# Patient Record
Sex: Male | Born: 1980 | Race: White | Hispanic: No | Marital: Single | Smoking: Never smoker
Health system: Southern US, Community
[De-identification: ages and names within clinical notes are randomized; demographics above are authoritative.]

## PROBLEM LIST (undated history)

## (undated) DIAGNOSIS — I10 Essential (primary) hypertension: Secondary | ICD-10-CM

---
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
## 2006-04-17 ENCOUNTER — Emergency Department: Payer: Self-pay | Admitting: Emergency Medicine

## 2006-04-20 ENCOUNTER — Emergency Department (HOSPITAL_COMMUNITY): Admission: EM | Admit: 2006-04-20 | Discharge: 2006-04-21 | Payer: Self-pay | Admitting: Emergency Medicine

## 2006-04-22 ENCOUNTER — Encounter: Admission: RE | Admit: 2006-04-22 | Discharge: 2006-04-22 | Payer: Self-pay | Admitting: Family Medicine

## 2006-09-29 ENCOUNTER — Ambulatory Visit (HOSPITAL_COMMUNITY): Admission: RE | Admit: 2006-09-29 | Discharge: 2006-09-29 | Payer: Self-pay | Admitting: Endocrinology

## 2011-11-14 ENCOUNTER — Ambulatory Visit (INDEPENDENT_AMBULATORY_CARE_PROVIDER_SITE_OTHER): Payer: BC Managed Care – PPO

## 2011-11-14 DIAGNOSIS — R635 Abnormal weight gain: Secondary | ICD-10-CM

## 2011-11-14 DIAGNOSIS — E039 Hypothyroidism, unspecified: Secondary | ICD-10-CM

## 2013-08-09 ENCOUNTER — Ambulatory Visit: Payer: Self-pay | Admitting: Family Medicine

## 2013-08-09 VITALS — BP 122/82 | HR 74 | Temp 98.7°F | Resp 18 | Ht 71.5 in | Wt 257.0 lb

## 2013-08-09 DIAGNOSIS — N529 Male erectile dysfunction, unspecified: Secondary | ICD-10-CM

## 2013-08-09 DIAGNOSIS — N4889 Other specified disorders of penis: Secondary | ICD-10-CM

## 2013-08-09 MED ORDER — SILDENAFIL CITRATE 100 MG PO TABS: ORAL_TABLET | ORAL | Status: DC

## 2013-08-09 NOTE — Progress Notes (Signed)
Subjective: Patient has a several year history of erectile difficulty for which he uses Viagra. He is in the process of getting insurance in hopes to arrange for a regular primary care doctor. He usually has gotten a years worth of refills on his medication. He is usually split is pale. He is not sure when he had the 100 on the 50 mg pills.  Objective: Long discussion but did not do an exam today  Assessment: Erectile dysfunction  Plan: Viagra 100 mg TM pills with 11 refills. I told her that one fourth of a pill may be sufficient for him. If he finds he cannot split them adequately he can call back and we can change the prescription for 50 mg.

## 2013-08-09 NOTE — Patient Instructions (Addendum)
Use the Viagra as directed  A range for a complete physical sometime  Return if problems

## 2015-04-12 ENCOUNTER — Encounter: Payer: 59 | Admitting: Neurology

## 2015-04-13 ENCOUNTER — Encounter: Payer: Self-pay | Admitting: Neurology

## 2015-04-16 DIAGNOSIS — Z0289 Encounter for other administrative examinations: Secondary | ICD-10-CM

## 2015-04-20 ENCOUNTER — Emergency Department (HOSPITAL_COMMUNITY): Payer: Worker's Compensation | Admitting: Certified Registered Nurse Anesthetist

## 2015-04-20 ENCOUNTER — Encounter (HOSPITAL_COMMUNITY)
Admission: EM | Disposition: A | Discharge status: Home / Self Care | Payer: Self-pay | Source: Home / Self Care | Attending: Emergency Medicine

## 2015-04-20 ENCOUNTER — Emergency Department (HOSPITAL_COMMUNITY): Payer: Worker's Compensation

## 2015-04-20 ENCOUNTER — Observation Stay (HOSPITAL_COMMUNITY)
Admission: EM | Admit: 2015-04-20 | Discharge: 2015-04-21 | Disposition: A | Discharge status: Home / Self Care | Payer: Worker's Compensation | Attending: Orthopedic Surgery | Admitting: Orthopedic Surgery

## 2015-04-20 ENCOUNTER — Encounter (HOSPITAL_COMMUNITY): Payer: Self-pay | Admitting: Family Medicine

## 2015-04-20 DIAGNOSIS — S66422A Laceration of intrinsic muscle, fascia and tendon of left thumb at wrist and hand level, initial encounter: Secondary | ICD-10-CM | POA: Diagnosis present

## 2015-04-20 DIAGNOSIS — M25342 Other instability, left hand: Secondary | ICD-10-CM | POA: Diagnosis not present

## 2015-04-20 DIAGNOSIS — Z6839 Body mass index (BMI) 39.0-39.9, adult: Secondary | ICD-10-CM | POA: Insufficient documentation

## 2015-04-20 DIAGNOSIS — I10 Essential (primary) hypertension: Secondary | ICD-10-CM | POA: Diagnosis not present

## 2015-04-20 DIAGNOSIS — F329 Major depressive disorder, single episode, unspecified: Secondary | ICD-10-CM | POA: Insufficient documentation

## 2015-04-20 DIAGNOSIS — S61012A Laceration without foreign body of left thumb without damage to nail, initial encounter: Secondary | ICD-10-CM | POA: Diagnosis present

## 2015-04-20 DIAGNOSIS — Y9389 Activity, other specified: Secondary | ICD-10-CM | POA: Insufficient documentation

## 2015-04-20 DIAGNOSIS — W312XXA Contact with powered woodworking and forming machines, initial encounter: Secondary | ICD-10-CM | POA: Diagnosis not present

## 2015-04-20 DIAGNOSIS — Y9269 Other specified industrial and construction area as the place of occurrence of the external cause: Secondary | ICD-10-CM | POA: Insufficient documentation

## 2015-04-20 DIAGNOSIS — Y990 Civilian activity done for income or pay: Secondary | ICD-10-CM | POA: Insufficient documentation

## 2015-04-20 DIAGNOSIS — F419 Anxiety disorder, unspecified: Secondary | ICD-10-CM | POA: Diagnosis not present

## 2015-04-20 HISTORY — PX: I & D EXTREMITY: SHX5045

## 2015-04-20 HISTORY — DX: Essential (primary) hypertension: I10

## 2015-04-20 HISTORY — PX: PERCUTANEOUS PINNING: SHX2209

## 2015-04-20 LAB — I-STAT CHEM 8, ED
BUN: 21 mg/dL — AB (ref 6–20)
CREATININE: 0.9 mg/dL (ref 0.61–1.24)
Calcium, Ion: 1.23 mmol/L (ref 1.12–1.23)
Chloride: 99 mmol/L — ABNORMAL LOW (ref 101–111)
GLUCOSE: 101 mg/dL — AB (ref 65–99)
HCT: 46 % (ref 39.0–52.0)
HEMOGLOBIN: 15.6 g/dL (ref 13.0–17.0)
POTASSIUM: 4.2 mmol/L (ref 3.5–5.1)
Sodium: 136 mmol/L (ref 135–145)
TCO2: 24 mmol/L (ref 0–100)

## 2015-04-20 LAB — CBC
HEMATOCRIT: 42.9 % (ref 39.0–52.0)
HEMOGLOBIN: 14.6 g/dL (ref 13.0–17.0)
MCH: 29.9 pg (ref 26.0–34.0)
MCHC: 34 g/dL (ref 30.0–36.0)
MCV: 87.7 fL (ref 78.0–100.0)
PLATELETS: 179 10*3/uL (ref 150–400)
RBC: 4.89 MIL/uL (ref 4.22–5.81)
RDW: 12.6 % (ref 11.5–15.5)
WBC: 6.1 10*3/uL (ref 4.0–10.5)

## 2015-04-20 SURGERY — IRRIGATION AND DEBRIDEMENT EXTREMITY
Anesthesia: General | Site: Thumb | Laterality: Left

## 2015-04-20 MED ORDER — LIDOCAINE HCL (PF) 1 % IJ SOLN
5.0000 mL | Freq: Once | INTRAMUSCULAR | Status: AC
  Administered 2015-04-20: 5 mL
  Filled 2015-04-20: qty 5

## 2015-04-20 MED ORDER — LIDOCAINE HCL 2 % IJ SOLN: 10.0000 mL | Freq: Once | INTRAMUSCULAR | Status: DC

## 2015-04-20 MED ORDER — SODIUM CHLORIDE 0.9 % IJ SOLN
INTRAMUSCULAR | Status: AC
  Filled 2015-04-20: qty 10

## 2015-04-20 MED ORDER — SUCCINYLCHOLINE CHLORIDE 20 MG/ML IJ SOLN
INTRAMUSCULAR | Status: AC
  Filled 2015-04-20: qty 1

## 2015-04-20 MED ORDER — MIDAZOLAM HCL 2 MG/2ML IJ SOLN
INTRAMUSCULAR | Status: AC
  Filled 2015-04-20: qty 2

## 2015-04-20 MED ORDER — ALPRAZOLAM 0.5 MG PO TABS: 0.5000 mg | ORAL_TABLET | Freq: Four times a day (QID) | ORAL | Status: DC | PRN

## 2015-04-20 MED ORDER — PHENYLEPHRINE HCL 10 MG/ML IJ SOLN
INTRAMUSCULAR | Status: DC | PRN
  Administered 2015-04-20 (×2): 80 ug via INTRAVENOUS

## 2015-04-20 MED ORDER — LIDOCAINE HCL (PF) 1 % IJ SOLN
10.0000 mL | Freq: Once | INTRAMUSCULAR | Status: AC
  Administered 2015-04-20: 10 mL
  Filled 2015-04-20: qty 10

## 2015-04-20 MED ORDER — CEFAZOLIN SODIUM-DEXTROSE 2-3 GM-% IV SOLR
INTRAVENOUS | Status: AC
  Filled 2015-04-20: qty 50

## 2015-04-20 MED ORDER — SODIUM CHLORIDE 0.9 % IR SOLN
Status: DC | PRN
  Administered 2015-04-20: 3000 mL

## 2015-04-20 MED ORDER — CEFAZOLIN SODIUM 1-5 GM-% IV SOLN
1.0000 g | Freq: Once | INTRAVENOUS | Status: AC
  Administered 2015-04-20: 1 g via INTRAVENOUS
  Filled 2015-04-20: qty 50

## 2015-04-20 MED ORDER — CEFAZOLIN SODIUM 1-5 GM-% IV SOLN
INTRAVENOUS | Status: AC
  Filled 2015-04-20: qty 50

## 2015-04-20 MED ORDER — OXYCODONE-ACETAMINOPHEN 5-325 MG PO TABS
1.0000 | ORAL_TABLET | Freq: Once | ORAL | Status: AC
  Administered 2015-04-20: 1 via ORAL
  Filled 2015-04-20: qty 1

## 2015-04-20 MED ORDER — LACTATED RINGERS IV SOLN
INTRAVENOUS | Status: DC
  Administered 2015-04-21: 02:00:00 via INTRAVENOUS

## 2015-04-20 MED ORDER — DEXAMETHASONE SODIUM PHOSPHATE 4 MG/ML IJ SOLN
INTRAMUSCULAR | Status: AC
  Filled 2015-04-20: qty 2

## 2015-04-20 MED ORDER — PROPOFOL 10 MG/ML IV BOLUS
INTRAVENOUS | Status: AC
  Filled 2015-04-20: qty 20

## 2015-04-20 MED ORDER — EPHEDRINE SULFATE 50 MG/ML IJ SOLN
INTRAMUSCULAR | Status: AC
  Filled 2015-04-20: qty 2

## 2015-04-20 MED ORDER — VITAMIN C 500 MG PO TABS: 1000.0000 mg | ORAL_TABLET | Freq: Every day | ORAL | Status: DC

## 2015-04-20 MED ORDER — FENTANYL CITRATE (PF) 100 MCG/2ML IJ SOLN
25.0000 ug | INTRAMUSCULAR | Status: DC | PRN
  Administered 2015-04-20: 25 ug via INTRAVENOUS
  Administered 2015-04-20: 50 ug via INTRAVENOUS

## 2015-04-20 MED ORDER — CEFAZOLIN SODIUM-DEXTROSE 2-3 GM-% IV SOLR
INTRAVENOUS | Status: DC | PRN
  Administered 2015-04-20: 2 g via INTRAVENOUS

## 2015-04-20 MED ORDER — FENTANYL CITRATE (PF) 100 MCG/2ML IJ SOLN
INTRAMUSCULAR | Status: AC
  Administered 2015-04-20: 50 ug via INTRAVENOUS
  Filled 2015-04-20: qty 2

## 2015-04-20 MED ORDER — LACTATED RINGERS IV SOLN
INTRAVENOUS | Status: DC | PRN
  Administered 2015-04-20 (×2): via INTRAVENOUS

## 2015-04-20 MED ORDER — PROPOFOL 10 MG/ML IV BOLUS
INTRAVENOUS | Status: DC | PRN
  Administered 2015-04-20: 250 mg via INTRAVENOUS

## 2015-04-20 MED ORDER — OXYCODONE HCL 5 MG/5ML PO SOLN: 5.0000 mg | Freq: Once | ORAL | Status: AC | PRN

## 2015-04-20 MED ORDER — FENTANYL CITRATE (PF) 100 MCG/2ML IJ SOLN
INTRAMUSCULAR | Status: DC | PRN
  Administered 2015-04-20 (×3): 50 ug via INTRAVENOUS

## 2015-04-20 MED ORDER — EPHEDRINE SULFATE 50 MG/ML IJ SOLN
INTRAMUSCULAR | Status: DC | PRN
  Administered 2015-04-20: 10 mg via INTRAVENOUS

## 2015-04-20 MED ORDER — OXYCODONE HCL 5 MG PO TABS
ORAL_TABLET | ORAL | Status: AC
  Filled 2015-04-20: qty 1

## 2015-04-20 MED ORDER — OXYCODONE HCL 5 MG PO TABS
5.0000 mg | ORAL_TABLET | Freq: Once | ORAL | Status: AC | PRN
  Administered 2015-04-20: 5 mg via ORAL

## 2015-04-20 MED ORDER — MIDAZOLAM HCL 5 MG/5ML IJ SOLN
INTRAMUSCULAR | Status: DC | PRN
  Administered 2015-04-20: 2 mg via INTRAVENOUS

## 2015-04-20 MED ORDER — BUPIVACAINE HCL (PF) 0.25 % IJ SOLN
INTRAMUSCULAR | Status: DC | PRN
  Administered 2015-04-20: 30 mL

## 2015-04-20 MED ORDER — CEFAZOLIN SODIUM 1-5 GM-% IV SOLN
1.0000 g | Freq: Three times a day (TID) | INTRAVENOUS | Status: DC
Start: 2015-04-20 — End: 2015-04-21
  Administered 2015-04-20 – 2015-04-21 (×2): 1 g via INTRAVENOUS
  Filled 2015-04-20 (×3): qty 50

## 2015-04-20 MED ORDER — ONDANSETRON HCL 4 MG/2ML IJ SOLN
INTRAMUSCULAR | Status: DC | PRN
  Administered 2015-04-20: 4 mg via INTRAVENOUS

## 2015-04-20 MED ORDER — OXYCODONE HCL 5 MG PO TABS
5.0000 mg | ORAL_TABLET | ORAL | Status: DC | PRN
  Administered 2015-04-21 (×2): 10 mg via ORAL
  Filled 2015-04-20 (×2): qty 2

## 2015-04-20 MED ORDER — MORPHINE SULFATE 2 MG/ML IJ SOLN: 1.0000 mg | INTRAMUSCULAR | Status: DC | PRN

## 2015-04-20 MED ORDER — BUPIVACAINE HCL (PF) 0.25 % IJ SOLN
INTRAMUSCULAR | Status: AC
  Filled 2015-04-20: qty 30

## 2015-04-20 MED ORDER — 0.9 % SODIUM CHLORIDE (POUR BTL) OPTIME
TOPICAL | Status: DC | PRN
  Administered 2015-04-20: 1000 mL

## 2015-04-20 MED ORDER — FENTANYL CITRATE (PF) 250 MCG/5ML IJ SOLN
INTRAMUSCULAR | Status: AC
  Filled 2015-04-20: qty 5

## 2015-04-20 MED ORDER — LIDOCAINE HCL (CARDIAC) 20 MG/ML IV SOLN
INTRAVENOUS | Status: DC | PRN
  Administered 2015-04-20: 60 mg via INTRAVENOUS

## 2015-04-20 MED ORDER — LIDOCAINE HCL (CARDIAC) 20 MG/ML IV SOLN
INTRAVENOUS | Status: AC
  Filled 2015-04-20: qty 15

## 2015-04-20 MED ORDER — PHENYLEPHRINE 40 MCG/ML (10ML) SYRINGE FOR IV PUSH (FOR BLOOD PRESSURE SUPPORT)
PREFILLED_SYRINGE | INTRAVENOUS | Status: AC
  Filled 2015-04-20: qty 50

## 2015-04-20 MED ORDER — FAMOTIDINE 20 MG PO TABS: 20.0000 mg | ORAL_TABLET | Freq: Two times a day (BID) | ORAL | Status: DC | PRN

## 2015-04-20 MED ORDER — DEXAMETHASONE SODIUM PHOSPHATE 10 MG/ML IJ SOLN
INTRAMUSCULAR | Status: DC | PRN
  Administered 2015-04-20: 8 mg via INTRAVENOUS

## 2015-04-20 SURGICAL SUPPLY — 61 items
APL SKNCLS STERI-STRIP NONHPOA (GAUZE/BANDAGES/DRESSINGS)
BANDAGE ELASTIC 3 VELCRO ST LF (GAUZE/BANDAGES/DRESSINGS) ×3 IMPLANT
BANDAGE ELASTIC 4 VELCRO ST LF (GAUZE/BANDAGES/DRESSINGS) ×3 IMPLANT
BENZOIN TINCTURE PRP APPL 2/3 (GAUZE/BANDAGES/DRESSINGS) IMPLANT
BLADE SURG ROTATE 9660 (MISCELLANEOUS) IMPLANT
BNDG CMPR MD 5X2 ELC HKLP STRL (GAUZE/BANDAGES/DRESSINGS) ×2
BNDG CONFORM 2 STRL LF (GAUZE/BANDAGES/DRESSINGS) ×1 IMPLANT
BNDG ELASTIC 2 VLCR STRL LF (GAUZE/BANDAGES/DRESSINGS) ×1 IMPLANT
BNDG GAUZE ELAST 4 BULKY (GAUZE/BANDAGES/DRESSINGS) ×9 IMPLANT
CORDS BIPOLAR (ELECTRODE) ×3 IMPLANT
COVER SURGICAL LIGHT HANDLE (MISCELLANEOUS) ×3 IMPLANT
CUFF TOURNIQUET SINGLE 18IN (TOURNIQUET CUFF) ×3 IMPLANT
CUFF TOURNIQUET SINGLE 24IN (TOURNIQUET CUFF) IMPLANT
DRSG ADAPTIC 3X8 NADH LF (GAUZE/BANDAGES/DRESSINGS) ×3 IMPLANT
DRSG EMULSION OIL 3X3 NADH (GAUZE/BANDAGES/DRESSINGS) ×1 IMPLANT
GAUZE SPONGE 4X4 12PLY STRL (GAUZE/BANDAGES/DRESSINGS) ×3 IMPLANT
GAUZE XEROFORM 1X8 LF (GAUZE/BANDAGES/DRESSINGS) ×4 IMPLANT
GLOVE BIO SURGEON STRL SZ7.5 (GLOVE) ×1 IMPLANT
GLOVE BIOGEL M STRL SZ7.5 (GLOVE) ×3 IMPLANT
GLOVE SS BIOGEL STRL SZ 8 (GLOVE) ×2 IMPLANT
GLOVE SUPERSENSE BIOGEL SZ 8 (GLOVE) ×1
GOWN STRL REUS W/ TWL LRG LVL3 (GOWN DISPOSABLE) ×6 IMPLANT
GOWN STRL REUS W/ TWL XL LVL3 (GOWN DISPOSABLE) ×6 IMPLANT
GOWN STRL REUS W/TWL LRG LVL3 (GOWN DISPOSABLE) ×9
GOWN STRL REUS W/TWL XL LVL3 (GOWN DISPOSABLE) ×9
HANDPIECE INTERPULSE COAX TIP (DISPOSABLE)
K-WIRE .045 CH (WIRE)
KIT BASIN OR (CUSTOM PROCEDURE TRAY) ×3 IMPLANT
KIT ROOM TURNOVER OR (KITS) ×3 IMPLANT
KWIRE .045 CH (WIRE) IMPLANT
MANIFOLD NEPTUNE II (INSTRUMENTS) ×3 IMPLANT
NDL HYPO 25GX1X1/2 BEV (NEEDLE) IMPLANT
NEEDLE 22X1 1/2 (OR ONLY) (NEEDLE) ×1 IMPLANT
NEEDLE HYPO 25GX1X1/2 BEV (NEEDLE) IMPLANT
NS IRRIG 1000ML POUR BTL (IV SOLUTION) ×3 IMPLANT
PACK ORTHO EXTREMITY (CUSTOM PROCEDURE TRAY) ×3 IMPLANT
PAD ARMBOARD 7.5X6 YLW CONV (MISCELLANEOUS) ×6 IMPLANT
PAD CAST 4YDX4 CTTN HI CHSV (CAST SUPPLIES) ×2 IMPLANT
PADDING CAST COTTON 4X4 STRL (CAST SUPPLIES) ×3
SET CYSTO W/LG BORE CLAMP LF (SET/KITS/TRAYS/PACK) ×1 IMPLANT
SET HNDPC FAN SPRY TIP SCT (DISPOSABLE) IMPLANT
SPLINT FIBERGLASS 3X35 (CAST SUPPLIES) ×1 IMPLANT
SPONGE GAUZE 4X4 12PLY STER LF (GAUZE/BANDAGES/DRESSINGS) ×1 IMPLANT
SPONGE LAP 18X18 X RAY DECT (DISPOSABLE) ×3 IMPLANT
SPONGE LAP 4X18 X RAY DECT (DISPOSABLE) ×3 IMPLANT
STRIP CLOSURE SKIN 1/2X4 (GAUZE/BANDAGES/DRESSINGS) IMPLANT
SUT CHROMIC 5 0 P 3 (SUTURE) ×1 IMPLANT
SUT ETHILON 4 0 P 3 18 (SUTURE) IMPLANT
SUT ETHILON 5 0 P 3 18 (SUTURE)
SUT FIBERWIRE 4-0 18 DIAM BLUE (SUTURE) ×3
SUT NYLON ETHILON 5-0 P-3 1X18 (SUTURE) IMPLANT
SUT PROLENE 4 0 P 3 18 (SUTURE) IMPLANT
SUT PROLENE 5 0 PS 2 (SUTURE) ×1 IMPLANT
SUTURE FIBERWR 4-0 18 DIA BLUE (SUTURE) IMPLANT
SYR CONTROL 10ML LL (SYRINGE) ×1 IMPLANT
TOWEL OR 17X24 6PK STRL BLUE (TOWEL DISPOSABLE) ×3 IMPLANT
TOWEL OR 17X26 10 PK STRL BLUE (TOWEL DISPOSABLE) ×3 IMPLANT
TUBE ANAEROBIC SPECIMEN COL (MISCELLANEOUS) IMPLANT
TUBE CONNECTING 12X1/4 (SUCTIONS) ×3 IMPLANT
WATER STERILE IRR 1000ML POUR (IV SOLUTION) ×3 IMPLANT
YANKAUER SUCT BULB TIP NO VENT (SUCTIONS) ×3 IMPLANT

## 2015-04-20 NOTE — Anesthesia Preprocedure Evaluation (Addendum)
Anesthesia Evaluation  Patient identified by MRN, date of birth, ID band Patient awake    Reviewed: Allergy & Precautions, NPO status , Patient's Chart, lab work & pertinent test results  History of Anesthesia Complications Negative for: history of anesthetic complications (no prior surgeries)  Airway Mallampati: III  TM Distance: >3 FB Neck ROM: Full    Dental  (+) Teeth Intact   Pulmonary neg pulmonary ROS,  breath sounds clear to auscultation        Cardiovascular hypertension, Pt. on medications - angina- Past MI and - CHF - dysrhythmias Rhythm:Regular     Neuro/Psych PSYCHIATRIC DISORDERS Anxiety negative neurological ROS     GI/Hepatic negative GI ROS, Neg liver ROS,   Endo/Other  Morbid obesity  Renal/GU negative Renal ROS     Musculoskeletal   Abdominal   Peds  Hematology   Anesthesia Other Findings   Reproductive/Obstetrics                            Anesthesia Physical Anesthesia Plan  ASA: II  Anesthesia Plan: General   Post-op Pain Management:    Induction: Intravenous  Airway Management Planned: LMA  Additional Equipment: None  Intra-op Plan:   Post-operative Plan: Extubation in OR  Informed Consent: I have reviewed the patients History and Physical, chart, labs and discussed the procedure including the risks, benefits and alternatives for the proposed anesthesia with the patient or authorized representative who has indicated his/her understanding and acceptance.   Dental advisory given  Plan Discussed with: Surgeon and CRNA  Anesthesia Plan Comments:        Anesthesia Quick Evaluation

## 2015-04-20 NOTE — Progress Notes (Signed)
Orthopedic Tech Progress Note Patient Details:  Justin Archer [birth year] [id number]  Ortho Devices Ortho Device/Splint Location: Rica Mast sling Ortho Device/Splint Interventions: Ordered, Application, Adjustment   Jennye Moccasin 04/20/2015, 10:33 PM

## 2015-04-20 NOTE — Op Note (Signed)
See op note  [id number] Candelario Steppe MD

## 2015-04-20 NOTE — ED Notes (Signed)
Pt states he "needs a minute before I collect any blood."

## 2015-04-20 NOTE — Anesthesia Procedure Notes (Addendum)
Procedure Name: LMA Insertion Date/Time: 04/20/2015 8:05 PM Performed by: Arlice Colt B Pre-anesthesia Checklist: Patient identified, Emergency Drugs available, Suction available, Patient being monitored and Timeout performed Patient Re-evaluated:Patient Re-evaluated prior to inductionOxygen Delivery Method: Circle system utilized Preoxygenation: Pre-oxygenation with 100% oxygen Intubation Type: IV induction LMA: LMA inserted LMA Size: 5.0 Placement Confirmation: positive ETCO2 Tube secured with: Tape Dental Injury: Teeth and Oropharynx as per pre-operative assessment

## 2015-04-20 NOTE — ED Provider Notes (Signed)
The patient is a 34 year old male who presents after suffering an injury to his left thumb with a chainsaw that happened at work. This occurred just prior to arrival. His pain is moderate to severe, worse with palpation, associated with an open injury over the interphalangeal joint of the left thumb. The patient is unable to hold his thumb in extension against resistance, exploration of the wound shows the structures is joint involvement. No foreign body seen.  I have personally viewed and interpreted the imaging and agree with radiologist interpretation. No FB or frx seen, no air in joint  Will need hand surgery consultation.  abx Npo  Medical screening examination/treatment/procedure(s) were conducted as a shared visit with non-physician practitioner(s) and myself.  I personally evaluated the patient during the encounter.  Clinical Impression:   Final diagnoses:  Laceration of left thumb with tendon involvement, initial encounter         Eber Hong, MD 04/21/15 [phone]

## 2015-04-20 NOTE — Transfer of Care (Signed)
Immediate Anesthesia Transfer of Care Note  Patient: Justin Archer  Procedure(s) Performed: Procedure(s): IRRIGATION AND DEBRIDEMENT LEFT THUMB WITH REPAIR EPL TENDON (Left)  IP PINNING (Left)  Patient Location: PACU  Anesthesia Type:General  Level of Consciousness: awake, alert  and oriented  Airway & Oxygen Therapy: Patient Spontanous Breathing  Post-op Assessment: Report given to RN and Post -op Vital signs reviewed and stable  Post vital signs: Reviewed and stable  Last Vitals:  Filed Vitals:   04/20/15 1857  BP: 128/81  Pulse: 76  Temp:   Resp: 20    Complications: No apparent anesthesia complications

## 2015-04-20 NOTE — ED Notes (Signed)
Pt here for left thumb laceration. sts was at work working with a saw and a piece came off and sliced finger.

## 2015-04-20 NOTE — H&P (Signed)
NOELLE HOOGLAND is an 34 y.o. male.   Chief Complaint: I cut my thumb HPI: The patient is a pleasant 34 year old male presents to the emergency room setting for evaluation of his left thumb. He sustained an on-the-job injury earlier today, he is employed Engineer, manufacturing. He is employed in [employer] and was working on a unit utilizing a saw, when he sustained a laceration to the dorsal aspect of the left thumb. Noted to have significant bleeding and loss of extension and presented to the emergency room setting for further evaluation. On evaluation by the emergency room staff he was noted to have loss of EPL functioning. The patient underwent digital block and irrigation. We were contacted in regards to his injury process. He denies injury to the remaining portion of the hand or extremity.  Past Medical History  Diagnosis Date  . Hypertension    depression  History reviewed. No pertinent past surgical history.  History reviewed. No pertinent family history. Social History:  reports that he has never smoked. He does not have any smokeless tobacco history on file. He reports that he does not drink alcohol or use illicit drugs.  Allergies:  Allergies  Allergen Reactions  . Iodine    Medications:  Metoprolol Chlorthalidone Paroxetine  (Not in a hospital admission)  Results for orders placed or performed during the hospital encounter of 04/20/15 (from the past 48 hour(s))  CBC     Status: None   Collection Time: 04/20/15 12:50 PM  Result Value Ref Range   WBC 6.1 4.0 - 10.5 K/uL   RBC 4.89 4.22 - 5.81 MIL/uL   Hemoglobin 14.6 13.0 - 17.0 g/dL   HCT 16.1 09.6 - 04.5 %   MCV 87.7 78.0 - 100.0 fL   MCH 29.9 26.0 - 34.0 pg   MCHC 34.0 30.0 - 36.0 g/dL   RDW 40.9 81.1 - 91.4 %   Platelets 179 150 - 400 K/uL  I-stat chem 8, ed     Status: Abnormal   Collection Time: 04/20/15 12:59 PM  Result Value Ref Range   Sodium 136 135 - 145 mmol/L   Potassium 4.2 3.5 - 5.1 mmol/L   Chloride 99 (L) 101 - 111 mmol/L   BUN 21 (H) 6 - 20 mg/dL   Creatinine, Ser 7.82 0.61 - 1.24 mg/dL   Glucose, Bld 956 (H) 65 - 99 mg/dL   Calcium, Ion 2.13 0.86 - 1.23 mmol/L   TCO2 24 0 - 100 mmol/L   Hemoglobin 15.6 13.0 - 17.0 g/dL   HCT 57.8 46.9 - 62.9 %   Dg Finger Thumb Left  04/20/2015   CLINICAL DATA:  Circular Sol accident with soft tissue laceration  EXAM: LEFT THUMB 2+V  COMPARISON:  None.  FINDINGS: A soft tissue laceration is noted over the dorsal aspect of the interphalangeal joint of the thumb. No underlying bony abnormality is seen. No radiopaque foreign body is noted.  IMPRESSION: Soft tissue injury without acute bony abnormality.   Electronically Signed   By: Alcide Clever M.D.   On: 04/20/2015 11:43    Review of Systems  Constitutional: Negative.   HENT: Negative.   Eyes: Negative.   Respiratory: Negative.   Cardiovascular: Negative.   Gastrointestinal: Negative.   Musculoskeletal:       See history present illness  Skin: Negative.   Neurological: Negative.     Blood pressure 129/78, pulse 69, temperature 97.6 F (36.4 C), temperature source Oral, resp. rate 16, height  (  1.854 m), weight 136.079 kg (300 lb), SpO2 99 %. Physical Exam  Evaluation of the left upper extremity shows that he has approximately a 2 cm laceration of the dorsal P of the left thumb. He is briefly been given a digital block and has some remaining numbness present. We have given him additional 10 cc of Xylocaine without epinephrine earlier examine the wound. Patient has a notable EPL laceration to the level of the bony phalanx present. Obvious loss of extensor functioning. Refill is intact. Flexion is intact. Main portion of the hand exam is without significant abnormality .The patient is alert and oriented in no acute distress. The patient complains of pain in the affected upper extremity.  The patient is noted to have a normal HEENT exam. Lung fields show equal chest expansion and no  shortness of breath. Abdomen exam is nontender without distention. Lower extremity examination does not show any fracture dislocation or blood clot symptoms. Pelvis is stable and the neck and back are stable and nontender.  Assessment/Plan On-the-job injury sustaining a laceration to the left dorsal thumb with loss of EPL function  History of hypertension History of depression .We are planning surgery for your upper extremity. The risk and benefits of surgery to include risk of bleeding, infection, anesthesia,  damage to normal structures and failure of the surgery to accomplish its intended goals of relieving symptoms and restoring function have been discussed in detail. With this in mind we plan to proceed. I have specifically discussed with the patient the pre-and postoperative regime and the dos and don'ts and risk and benefits in great detail. Risk and benefits of surgery also include risk of dystrophy(CRPS), chronic nerve pain, failure of the healing process to go onto completion and other inherent risks of surgery The relavent the pathophysiology of the disease/injury process, as well as the alternatives for treatment and postoperative course of action has been discussed in great detail with the patient who desires to proceed.  We will do everything in our power to help you (the patient) restore function to the upper extremity. It is a pleasure to see this patient today.   Audryna Wendt L 04/20/2015, 5:38 PM

## 2015-04-20 NOTE — ED Provider Notes (Signed)
CSN: [id number]     Arrival date & time 04/20/15  1036 History  This chart was scribed for non-physician practitioner, Fayrene Helper, PA-C, working with Eber Hong, MD by Charline Bills, ED Scribe. This patient was seen in room TR09C/TR09C and the patient's care was started at 10:49 AM.   Chief Complaint  Patient presents with  . Laceration   The history is provided by the patient. No language interpreter was used.   HPI Comments: Justin Archer is a 34 y.o. male who presents to the Emergency Department complaining of a laceration to left thumb sustained approximately 30 minutes ago. Pt was using a circular saw at work when a piece of the saw came off and sliced his thumb. He denies associated pain at this time but reports numbness that he attributes to carpal tunnel. Pt reports difficulty extending his thumb. He further reports that his thumb feels like "2 rods pushing against each other" with extension. Pt is R hand dominant. Pt's tdap is UTD. Allergy to Iodine.   Past Medical History  Diagnosis Date  . Hypertension    History reviewed. No pertinent past surgical history. History reviewed. No pertinent family history. History  Substance Use Topics  . Smoking status: Never Smoker   . Smokeless tobacco: Not on file  . Alcohol Use: No    Review of Systems  Skin: Positive for wound.  Neurological: Positive for numbness (chronic).   Allergies  Iodine  Home Medications   Prior to Admission medications   Medication Sig Start Date End Date Taking? Authorizing Provider  sildenafil (VIAGRA) 100 MG tablet Use as directed for erectile dysfunction 08/09/13   Peyton Najjar, MD   BP 125/82 mmHg  Pulse 76  Temp(Src) 98.3 F (36.8 C) (Oral)  Resp 18  Ht 6\' 1"  (1.854 m)  Wt 300 lb (136.079 kg)  BMI 39.59 kg/m2  SpO2 94% Physical Exam  Constitutional: He is oriented to person, place, and time. He appears well-developed and well-nourished. No distress.  HENT:  Head: Normocephalic and  atraumatic.  Eyes: Conjunctivae and EOM are normal.  Neck: Neck supple. No tracheal deviation present.  Cardiovascular: Normal rate.   Pulmonary/Chest: Effort normal. No respiratory distress.  Musculoskeletal: Normal range of motion.  L hand: L thumb with 2 cm laceration noted to the IP dorsally with possible joint involvement. Normal finger flexion. Decreased finger extension. Sensation intact distally with brisk cap refill.  Neurological: He is alert and oriented to person, place, and time.  Skin: Skin is warm and dry.  Psychiatric: He has a normal mood and affect. His behavior is normal.  Nursing note and vitals reviewed.  ED Course  Procedures (including critical care time) DIAGNOSTIC STUDIES: Oxygen Saturation is 94% on RA, adequate by my interpretation.    COORDINATION OF CARE: 10:53 AM-Pt accidentally cut his L thumb across the IP joint with suspect extensor tendon injury.  Discussed treatment plan which includes XR and Percocet with pt at bedside and pt agreed to plan.   LACERATION REPAIR PROCEDURE NOTE The patient's identification was confirmed and consent was obtained. This procedure was performed by Fayrene Helper, PA-C at 11:56 AM. Site: L thumb Sterile procedures observed Anesthetic used (type and amt): 1% lidocaine without epinephrine  Suture type/size:none Length: 2 cm # of Sutures: none Technique:exploration and irrigation Complexity: complex Antibx ointment applied: none Tetanus UTD  Site anesthetized, irrigated with NS, explored without evidence of foreign body, vessel and extensor tendon injury. site covered with dry, sterile dressing.  Patient tolerated procedure well without complications.  12:09 PM Pt with L thumb laceration.  Initial exam with evidence of extensor tendon injury and joint involvement without bone involvement.  I discussed with Dr. Hyacinth Meeker who recommend consulting hand specialist for further care.    12:52 PM Appreciate consultation from hand  specialist Dr. Amanda Pea who will see pt in the ER and will perform further management.  Will obtain basic labs, and give IV abx.  Pt is aware of plan.      Labs Review Labs Reviewed  I-STAT CHEM 8, ED - Abnormal; Notable for the following:    Chloride 99 (*)    BUN 21 (*)    Glucose, Bld 101 (*)    All other components within normal limits  CBC    Imaging Review Dg Finger Thumb Left  04/20/2015   CLINICAL DATA:  Circular Sol accident with soft tissue laceration  EXAM: LEFT THUMB 2+V  COMPARISON:  None.  FINDINGS: A soft tissue laceration is noted over the dorsal aspect of the interphalangeal joint of the thumb. No underlying bony abnormality is seen. No radiopaque foreign body is noted.  IMPRESSION: Soft tissue injury without acute bony abnormality.   Electronically Signed   By: Alcide Clever M.D.   On: 04/20/2015 11:43    EKG Interpretation None      MDM   Final diagnoses:  Laceration of left thumb with tendon involvement, initial encounter    BP 129/78 mmHg  Pulse 69  Temp(Src) 97.6 F (36.4 C) (Oral)  Resp 16  Ht  (1.854 m)  Wt 300 lb (136.079 kg)  BMI 39.59 kg/m2  SpO2 99%  I have reviewed nursing notes and vital signs. I personally viewed the imaging tests through PACS system and agrees with radiologist's intepretation I reviewed available ER/hospitalization records through the EMR   I personally performed the services described in this documentation, which was scribed in my presence. The recorded information has been reviewed and is accurate.     Fayrene Helper, PA-C 04/20/15 1548  Eber Hong, MD 04/21/15 [phone]

## 2015-04-21 MED ORDER — OXYCODONE HCL 5 MG PO TABS: 5.0000 mg | ORAL_TABLET | ORAL | Status: DC | PRN

## 2015-04-21 MED ORDER — CEPHALEXIN 500 MG PO CAPS: 500.0000 mg | ORAL_CAPSULE | Freq: Four times a day (QID) | ORAL | Status: DC

## 2015-04-21 NOTE — Op Note (Signed)
NAMEJERAMEY, Justin Archer NO.:  [id number]  MEDICAL RECORD NO.:  [id number]  LOCATION:  5N30C                        FACILITY:  [facility]  PHYSICIAN:  Dionne Ano. Kiyon Fidalgo, M.D.DATE OF BIRTH:  January 18, 1981  DATE OF PROCEDURE:  04/20/2015 DATE OF DISCHARGE:                              OPERATIVE REPORT   PREOPERATIVE DIAGNOSIS:  Open left thumb IP joint injury with extensor disruption and soft tissue disarray.  POSTOPERATIVE DIAGNOSIS:  Open left thumb IP joint injury with extensor disruption and soft tissue disarray.  PROCEDURE: 1. Irrigation and debridement of open interphalangeal joint injury.     This was an excisional debridement of skin, subcutaneous tissue,     tendon, and periosteal tissue.  It consisted of arthrotomy and     synovectomy of the joint. 2. IP joint pinning secondary to open IP joint injury.  This was a     pinning interphalangeal joint secondary to an open interphalangeal     joint and instability. 3. Extensor pollicis longus tendon repair, left thumb. 4. Complex wound repair, left thumb. 5. AP, lateral, and oblique x-rays performed, examined, and     interpreted by myself of the left upper extremity.  SURGEON:  Dionne Ano. Amanda Pea, M.D.  ASSISTANT:  Karie Chimera, P.A.-C.  COMPLICATIONS:  None.  ANESTHESIA:  General.  TOURNIQUET TIME:  Less than 30 minutes.  INDICATIONS:  This patient is a 34 year old male presents with the above- mentioned diagnosis.  He has an open left thumb IP joint with extensor tendon after laceration.  He understands risks and benefits, timeframe duration of recovery, do's and don'ts, and other issues as it is remained in his upper extremity.  OPERATION IN DETAIL:  The patient was seen by myself and Anesthesia, taken to the operating suite, smooth induction of general anesthesia. Time-out was called.  Arm had been marked, consent signed, all questions encouraged and answered, and verified.  Following this, the  patient underwent a very careful and cautious approach to the extremity with irrigation and debridement of skin, subcutaneous tissue, and synovium as well as the open IP joint.  3 L of saline were placed to the wound. This was a copious aggressive lavage.  This was an arthrotomy as well and synovectomy of the joint with removal of pre-necrotic tissue.  Following this, the patient had IP joint pinning with 0.045 K-wire utilizing AP, lateral, and oblique x-rays performed, examined, and interpreted by myself.  K-wire was placed to the IP joint.  Hold the joint still for healing purposes.  Following this, placement of the pin which was entered from distal to proximal was pre-bent and clipped and buried for later removal in the office.  Following this, we then turned attention toward the extensor tenon.  The patient had a small cuff of EPL tendon and used a 4-0 FiberWire to weave in a modified Krackow stitch with capsular modification, the tendon so as to coapt into the tendon nicely.  The tendon thus was repaired without difficulty.  Tourniquet was deflated.  Hemostasis secured.  Wound closed with interrupted chromic and Prolene of 5-0 variety.  Adaptic, Xeroform, gauze, Kerlix, Webril, and a thumb splint was placed without difficulty. The patient tolerated  this well.  He will be monitored closely in the recovery room.  We will make a decision this overnight stay or not pending this progress. He tolerated the procedure well.  We will hold him still for 6 weeks at the IP joint.  We will consider pin removal at 4-6 weeks and definitely hold the IP completely still for 6 weeks.  These notes have been discussed.  All questions have been encouraged and answered.  He tolerated the procedure well.  There were no immediate intraoperative complicating features.  His tendon was rather jagged and torn which is not unexpected.  Given the severity of his injury, nevertheless we were able to repair  this without significant difficulty.     Dionne Ano. Amanda Pea, M.D.     [facility]  D:  04/20/2015  T:  04/21/2015  Job:  [id number]

## 2015-04-21 NOTE — Anesthesia Postprocedure Evaluation (Signed)
  Anesthesia Post-op Note  Patient: Justin Archer  Procedure(s) Performed: Procedure(s): IRRIGATION AND DEBRIDEMENT LEFT THUMB WITH REPAIR EPL TENDON (Left)  IP PINNING (Left)  Patient Location: PACU  Anesthesia Type:General  Level of Consciousness: awake  Airway and Oxygen Therapy: Patient Spontanous Breathing  Post-op Pain: mild  Post-op Assessment: Post-op Vital signs reviewed, Patient's Cardiovascular Status Stable, Respiratory Function Stable, Patent Airway, No signs of Nausea or vomiting and Pain level controlled              Post-op Vital Signs: Reviewed and stable  Last Vitals:  Filed Vitals:   04/21/15 0540  BP: 126/75  Pulse: 100  Temp: 36.4 C  Resp: 16    Complications: No apparent anesthesia complications

## 2015-04-21 NOTE — Discharge Instructions (Signed)

## 2015-04-21 NOTE — Discharge Summary (Signed)
Physician Discharge Summary  Patient ID: Justin Archer MRN: [id number] DOB/AGE: Apr 13, 1981 34 y.o.  Admit date: 04/20/2015 Discharge date: 04/21/2015  Admission Diagnoses: On-the-job injury sustaining a left thumb IP laceration with EPL laceration History of depression History of hypertension  Discharge Diagnoses:  Active Problems:   Laceration of left thumb with tendon involvement  same, improved  Discharged Condition: Stable  Hospital Course: The patient is a pleasant 34 year old man who sustained an on-the-job injury which resulted in an EPL laceration to the left thumb at the dorsal IP level. He was seen and examined and had a notable loss of function about the EPL tendon. We discussed with him all issues and the need to proceed with surgical intervention the form of an I and D as well as EPL repair and IP joint pinning. Patient was in agreement. We operatively he was noted to be stable and the decision was made to taken to the operative suite for repair as needed. The patient underwent copious irrigation and excisional debridement and repair of the EPL tendon to the left thumb. In addition the IP joint was pinned given the distal nature of the repair. He tolerated the procedure well and there were no complications, please see operative report for full details. The patient was admitted to the orthopedic unit with standard observation orders, pain management, IV antibiotics. He did very well there was no  postoperative complications present. Postoperative day #1 the patient was doing well he is having no difficulties, he was tolerating a regular diet, he was voiding without difficulties. He is noted to be stable upon his examination. The decision made to discharge him home in stable condition. Discussed with the patient wound care, keeping the upper extremity elevated, edema control and all issues   Treatments: 1. Irrigation and debridement of open interphalangeal joint injury.  This was  an excisional debridement of skin, subcutaneous tissue,  tendon, and periosteal tissue. It consisted of arthrotomy and  synovectomy of the joint. 2. IP joint pinning secondary to open IP joint injury. This was a  pinning interphalangeal joint secondary to an open interphalangeal  joint and instability. 3. Extensor pollicis longus tendon repair, left thumb. 4. Complex wound repair, left thumb. 5. AP, lateral, and oblique x-rays performed, examined    Discharge Exam: Blood pressure 126/75, pulse 100, temperature 97.5 F (36.4 C), temperature source Oral, resp. rate 16, height 6\' 1"  (1.854 m), weight 136 kg (299 lb 13.2 oz), SpO2 97 %. Examination of the left upper extremity reveals that the dressings are clean and intact, there is no signs of infection, he has excellent digital range of motion, the thumb is not exposed, refill is intact to the digits no signs of ascending infection/cellulitis are present .The patient is alert and oriented in no acute distress. The patient complains of pain in the affected upper extremity.  The patient is noted to have a normal HEENT exam. Lung fields show equal chest expansion and no shortness of breath. Abdomen exam is nontender without distention. Lower extremity examination does not show any fracture dislocation or blood clot symptoms. Pelvis is stable and the neck and back are stable and nontender.  Disposition: Final discharge disposition not confirmed  Discharge Instructions    Call MD / Call 911    Complete by:  As directed   If you experience chest pain or shortness of breath, CALL 911 and be transported to the hospital emergency room.  If you develope a fever above 101 F, pus (white  drainage) or increased drainage or redness at the wound, or calf pain, call your surgeon's office.     Constipation Prevention    Complete by:  As directed   Drink plenty of fluids.  Prune juice may be helpful.  You may use a stool softener, such as  Colace (over the counter) 100 mg twice a day.  Use MiraLax (over the counter) for constipation as needed.     Diet - low sodium heart healthy    Complete by:  As directed      Discharge instructions    Complete by:  As directed   .Keep bandage clean and dry.  Call for any problems.  No smoking.  Criteria for driving a car: you should be off your pain medicine for 7-8 hours, able to drive one handed(confident), thinking clearly and feeling able in your judgement to drive. Continue elevation as it will decrease swelling.  If instructed by MD move your fingers within the confines of the bandage/splint.  Use ice if instructed by your MD. Call immediately for any sudden loss of feeling in your hand/arm or change in functional abilities of the extremity. .We recommend that you to take vitamin C 1000 mg a day to promote healing. We also recommend that if you require  pain medicine that you take a stool softener to prevent constipation as most pain medicines will have constipation side effects. We recommend either Peri-Colace or Senokot and recommend that you also consider adding MiraLAX to prevent the constipation affects from pain medicine if you are required to use them. These medicines are over the counter and maybe purchased at a local pharmacy. A cup of yogurt and a probiotic can also be helpful during the recovery process as the medicines can disrupt your intestinal environment.     Increase activity slowly as tolerated    Complete by:  As directed             Medication List    TAKE these medications        cephALEXin 500 MG capsule  Commonly known as:  KEFLEX  Take 1 capsule (500 mg total) by mouth 4 (four) times daily.     oxyCODONE 5 MG immediate release tablet  Commonly known as:  Oxy IR/ROXICODONE  Take 1-2 tablets (5-10 mg total) by mouth every 3 (three) hours as needed for moderate pain.     sildenafil 100 MG tablet  Commonly known as:  VIAGRA  Use as directed for erectile  dysfunction           Follow-up Information    Follow up with Karen Chafe, MD. Schedule an appointment as soon as possible for a visit in 12 days.   Specialty:  Orthopedic Surgery   Why:  For wound re-check, For suture removal,    Contact information:   [address] [place] [address] [phone]       Signed: Sheran Lawless 04/21/2015, 10:41 AM

## 2015-04-23 ENCOUNTER — Encounter (HOSPITAL_COMMUNITY): Payer: Self-pay | Admitting: Orthopedic Surgery

## 2015-05-02 ENCOUNTER — Encounter: Payer: 59 | Admitting: Neurology

## 2015-05-09 ENCOUNTER — Ambulatory Visit (INDEPENDENT_AMBULATORY_CARE_PROVIDER_SITE_OTHER): Payer: Self-pay | Admitting: Neurology

## 2015-05-09 DIAGNOSIS — Z0289 Encounter for other administrative examinations: Secondary | ICD-10-CM

## 2015-05-09 DIAGNOSIS — S61012A Laceration without foreign body of left thumb without damage to nail, initial encounter: Secondary | ICD-10-CM

## 2015-05-09 NOTE — Progress Notes (Signed)
Patient was rescheduled

## 2015-07-04 ENCOUNTER — Encounter (INDEPENDENT_AMBULATORY_CARE_PROVIDER_SITE_OTHER): Payer: Self-pay

## 2015-07-04 ENCOUNTER — Encounter: Payer: Self-pay | Admitting: Neurology

## 2015-07-04 ENCOUNTER — Ambulatory Visit (INDEPENDENT_AMBULATORY_CARE_PROVIDER_SITE_OTHER): Payer: 59 | Admitting: Neurology

## 2015-07-04 ENCOUNTER — Ambulatory Visit (INDEPENDENT_AMBULATORY_CARE_PROVIDER_SITE_OTHER): Payer: Self-pay | Admitting: Neurology

## 2015-07-04 DIAGNOSIS — G5602 Carpal tunnel syndrome, left upper limb: Secondary | ICD-10-CM

## 2015-07-04 DIAGNOSIS — G5603 Carpal tunnel syndrome, bilateral upper limbs: Secondary | ICD-10-CM

## 2015-07-04 DIAGNOSIS — Z0289 Encounter for other administrative examinations: Secondary | ICD-10-CM

## 2015-07-04 DIAGNOSIS — G5601 Carpal tunnel syndrome, right upper limb: Secondary | ICD-10-CM

## 2015-07-04 NOTE — Progress Notes (Signed)
  [facility]    Provider:  Dr Lucia Gaskins Referring Provider: Renford Dills, MD Primary Care Physician:  Katy Apo, MD  HPI:  Justin Archer is a 34 y.o. male here as a referral from Dr. Nehemiah Settle for evaluation of CTS. Symptoms started in 2012. He has numbness in digits 1-3 bilaterally and decreased fine motor abilities. Symptoms are worsening. He finds it hard to button his pants and needs help. He can't button his shirt. His girlfriend has to help him. He denies any weakness. Great strength, more fine motor difficulty and numbness. He endorses nocturnal awakening for pain in his hands and tries to rub it out. No neck pain or radicular symptoms.  Summary:   Nerve Conduction Studies were performed on the bilateral upper extremities.  The right median APB motor nerve showed prolonged distal onset latency (4.5 ms, N<4.0). The right Median 2nd Digit sensory nerve showed prolonged distal peak latency (4.5 ms, N<3.9). F Wave studies indicate that the right Median F wave has normal latency.  The left median APB motor nerve showed prolonged distal onset latency (7.9 ms, N<4.0) and reduced amplitude (0.5uv, N>3) The left Median 2nd Digit sensory nerve showed no response F Wave studies indicate that the left Median F wave has delayed latency(37, N<38ms).   Bilateral Ulnar ADM motor nerves were within normal limits. F Wave studies indicate that the bilateral Ulnar F waves have normal latencies The bilateralUlnar 5th digit sensory nerves were within normal limits.   EMG needle study of selected bilateral upper extremity muscles was performed. The bilateral opponens pollicis muscles showed moderately increased spontaneous activity (positive sharp waves and fibrillation potentials) and diminished motor unit recruitment. The following muscles were normal: Deltoid, Triceps, Pronator Teres, First Dorsal Interosseous, c7/c8 paraspinal muscles  Conclusion: This is an abnormal  study. 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 No suggestion of polyneuropathy or radiculopathy.   Justin Dean, MD  [facility] [address] [place], [place] [address]  Phone [phone] Fax [phone]

## 2015-07-04 NOTE — Procedures (Signed)
[facility]    PZOXWRUEAer:  Dr Lucia Gaskins Referring Provider: Renford Dills, MD Primary Care Physician:  Katy Apo, MD  HPI:  Justin Archer is a 34 y.o. male here as a referral from Dr. Nehemiah Settle for evaluation of CTS. Symptoms started in 2012. He has numbness in digits 1-3 bilaterally and decreased fine motor abilities. Symptoms are worsening. He finds it hard to button his pants and needs help. He can't button his shirt. His girlfriend has to help him. He denies any weakness. Great strength, more fine motor difficulty and numbness. He endorses nocturnal awakening for pain in his hands and tries to rub it out. No neck pain or radicular symptoms.  Summary:   Nerve Conduction Studies were performed on the bilateral upper extremities.  The right median APB motor nerve showed prolonged distal onset latency (4.5 ms, N<4.0). The right Median 2nd Digit sensory nerve showed prolonged distal peak latency (4.5 ms, N<3.9). F Wave studies indicate that the right Median F wave has normal latency.  The left median APB motor nerve showed prolonged distal onset latency (7.9 ms, N<4.0) and reduced amplitude (0.5uv, N>3) The left Median 2nd Digit sensory nerve showed no response F Wave studies indicate that the left Median F wave has delayed latency(37, N<66ms).   Bilateral Ulnar ADM motor nerves were within normal limits. F Wave studies indicate that the bilateral Ulnar F waves have normal latencies The bilateralUlnar 5th digit sensory nerves were within normal limits.   EMG needle study of selected bilateral upper extremity muscles was performed. The bilateral opponens pollicis muscles showed moderately increased spontaneous activity (positive sharp waves and fibrillation potentials) and diminished motor unit recruitment. The following muscles were normal: Deltoid, Triceps, Pronator Teres, First Dorsal Interosseous, c7/c8 paraspinal muscles  Conclusion: This is an abnormal study.  There is electrophysiologic evidence of left>right severe bilateral Carpal Tunnel Syndrome. No suggestion of polyneuropathy or radiculopathy.   Naomie Dean, MD  [facility] [address] [place], [place] [address]  Phone [phone] Fax [phone]

## 2015-07-04 NOTE — Progress Notes (Signed)
See procedure note.

## 2015-07-24 ENCOUNTER — Ambulatory Visit (INDEPENDENT_AMBULATORY_CARE_PROVIDER_SITE_OTHER): Payer: 59 | Admitting: Family Medicine

## 2015-07-24 VITALS — BP 140/98 | HR 108 | Temp 97.8°F | Resp 18 | Ht 72.5 in | Wt 320.0 lb

## 2015-07-24 DIAGNOSIS — Z708 Other sex counseling: Secondary | ICD-10-CM | POA: Diagnosis not present

## 2015-07-24 DIAGNOSIS — Z1159 Encounter for screening for other viral diseases: Secondary | ICD-10-CM | POA: Diagnosis not present

## 2015-07-24 DIAGNOSIS — Z113 Encounter for screening for infections with a predominantly sexual mode of transmission: Secondary | ICD-10-CM | POA: Diagnosis not present

## 2015-07-24 DIAGNOSIS — A630 Anogenital (venereal) warts: Secondary | ICD-10-CM

## 2015-07-24 NOTE — Patient Instructions (Signed)
I will contact you within the week for the results.

## 2015-07-24 NOTE — Progress Notes (Signed)
[facility] [address] [place] [address] [phone]- [phone]  Date:  07/24/2015   Name:  Justin Archer   DOB:  09-Mar-1981   MRN:  [id number]  PCP:  Katy Apo, MD    History of Present Illness:  Justin Archer is a 34 y.o. male patient who presents to [facility] for desire to have std check.  He has plan to wed Dec 2017, and he is not sexually active.  He has not been sexually active for years.  Him and his fiance have not been sexually active, and she claims virginity.  He is concerned that he not transmit anything to her due to his phase in his mid 6s when he was "promiscuous".  Heterosexual relationships only.   While consult for a vasectomy, he was told that he has genital warts, which the surgeon will burn off during the procedure.  He is concerned that he never had a diagnosis of this.  They started as brown lesions that grew and darkened over the last years.  No pruritus or pain.   Denies all penile discharge, frequency, hematuria, or dysuria.    He has been sober from alcohol for 6 months. No illicit drug use. No tobacco use.    Patient Active Problem List   Diagnosis Date Noted  . Laceration of left thumb with tendon involvement 04/20/2015    Past Medical History  Diagnosis Date  . Hypertension     Past Surgical History  Procedure Laterality Date  . I&d extremity Left 04/20/2015    Procedure: IRRIGATION AND DEBRIDEMENT LEFT THUMB WITH REPAIR EPL TENDON;  Surgeon: Dominica Severin, MD;  Location: [facility] OR;  Service: Orthopedics;  Laterality: Left;  . Percutaneous pinning Left 04/20/2015    Procedure:  IP PINNING;  Surgeon: Dominica Severin, MD;  Location: [facility] OR;  Service: Orthopedics;  Laterality: Left;    Social History  Substance Use Topics  . Smoking status: Never Smoker   . Smokeless tobacco: None  . Alcohol Use: No    No family history on file.  Allergies  Allergen Reactions  . Iodine     Medication list has been reviewed and  updated.  Current Outpatient Prescriptions on File Prior to Visit  Medication Sig Dispense Refill  . sildenafil (VIAGRA) 100 MG tablet Use as directed for erectile dysfunction 10 tablet 11  . cephALEXin (KEFLEX) 500 MG capsule Take 1 capsule (500 mg total) by mouth 4 (four) times daily. (Patient not taking: Reported on 07/24/2015) 40 capsule 0  . oxyCODONE (OXY IR/ROXICODONE) 5 MG immediate release tablet Take 1-2 tablets (5-10 mg total) by mouth every 3 (three) hours as needed for moderate pain. (Patient not taking: Reported on 07/24/2015) 30 tablet 0   No current facility-administered medications on file prior to visit.    ROS ROS otherwise unremarkable unless listed above.   Physical Examination: BP 140/98 mmHg  Pulse 108  Temp(Src) 97.8 F (36.6 C) (Oral)  Resp 18  Ht 6' 0.5" (1.842 m)  Wt 320 lb (145.151 kg)  BMI 42.78 kg/m2  SpO2 98% Ideal Body Weight: Weight in (lb) to have BMI = 25: 186.5  Physical Exam Patient declined male provider, and Dr. Neva Seat performed physical exam in following note.   Assessment and Plan: 34 year old male is here today for desire of std testing.   -Following labs ordered per recommendation, as well as his personal request. -He will follow up with elevated BP with Dr. Nehemiah Settle, his  PCP--however does note that this has improved greatly, especially since his etOH cessation. -Advised patient of sexual practices to decrease transmission such as the removal of these warts, and that his fiance have the gardasil injections.  Screening for STD (sexually transmitted disease) - Plan: HSV(herpes simplex vrs) 1+2 ab-IgG, RPR, HIV antibody, GC/Chlamydia Probe Amp, Hepatitis C antibody  Need for hepatitis C screening test - Plan: Hepatitis C antibody  Genital warts  Counseling for sexually transmitted disease  Trena Platt, PA-C [facility] 07/24/2015 9:52 PM

## 2015-07-24 NOTE — Progress Notes (Deleted)
[facility] [address] [place] [address] [phone]  Date:  07/24/2015   Name:  Justin Archer   DOB:  February 12, 1981   MRN:  [id number]  PCP:  Katy Apo, MD    History of Present Illness:  Justin Archer is a 34 y.o. male patient who presents to [facility] for c     Patient Active Problem List   Diagnosis Date Noted  . Laceration of left thumb with tendon involvement 04/20/2015    Past Medical History  Diagnosis Date  . Hypertension     Past Surgical History  Procedure Laterality Date  . I&d extremity Left 04/20/2015    Procedure: IRRIGATION AND DEBRIDEMENT LEFT THUMB WITH REPAIR EPL TENDON;  Surgeon: Dominica Severin, MD;  Location: [facility] OR;  Service: Orthopedics;  Laterality: Left;  . Percutaneous pinning Left 04/20/2015    Procedure:  IP PINNING;  Surgeon: Dominica Severin, MD;  [facility];  Service: Orthopedics;  Laterality: Left;    Social History  Substance Use Topics  . Smoking status: Never Smoker   . Smokeless tobacco: None  . Alcohol Use: No    No family history on file.  Allergies  Allergen Reactions  . Iodine     Medication list has been reviewed and updated.  Current Outpatient Prescriptions on File Prior to Visit  Medication Sig Dispense Refill  . sildenafil (VIAGRA) 100 MG tablet Use as directed for erectile dysfunction 10 tablet 11  . cephALEXin (KEFLEX) 500 MG capsule Take 1 capsule (500 mg total) by mouth 4 (four) times daily. (Patient not taking: Reported on 07/24/2015) 40 capsule 0  . oxyCODONE (OXY IR/ROXICODONE) 5 MG immediate release tablet Take 1-2 tablets (5-10 mg total) by mouth every 3 (three) hours as needed for moderate pain. (Patient not taking: Reported on 07/24/2015) 30 tablet 0   No current facility-administered medications on file prior to visit.    ROS   Physical Examination: BP 140/98 mmHg  Pulse 108  Temp(Src) 97.8 F (36.6 C) (Oral)  Resp 18  Ht 6' 0.5" (1.842 m)  Wt 320 lb (145.151 kg)   BMI 42.78 kg/m2  SpO2 98% Ideal Body Weight: Weight in (lb) to have BMI = 25: 186.5  Physical Exam   Assessment and Plan:  Contact apt [address] 1. Screening for STD (sexually transmitted disease) - HSV(herpes simplex vrs) 1+2 ab-IgG - RPR - HIV antibody - GC/Chlamydia Probe Amp - Hepatitis C antibody  2. Need for hepatitis C screening test - Hepatitis C antibody   Trena Platt, PA-C [facility] 07/24/2015 10:07 PM

## 2015-07-24 NOTE — Progress Notes (Signed)
   Subjective:    Patient ID: Justin Archer, male    DOB: Mar 04, 1981, 34 y.o.   MRN: [id number]  HPI   patient discussed with Ms. English. Agree with assessment and plan per her note. Exam as below performed by me. Lesions appear to be consistent with genital warts. Has plan for follow-up with urologist for cryo-destruction. All questions answered.  Review of Systems     Objective:   Physical Exam  Genitourinary: No penile erythema or penile tenderness. No discharge found.  On the left penile shaft there were 2 lesions that were 2-3 mm, hyperpigmented, slightly elevated verrucal appearing lesions. On the right penile shaft there were 2 similar appearing lesions:  2-3 mm across hyperpigmented and vertical appearing as well.            Assessment & Plan:

## 2015-07-25 LAB — HEPATITIS C ANTIBODY: HCV Ab: NEGATIVE

## 2015-07-25 LAB — HIV ANTIBODY (ROUTINE TESTING W REFLEX): HIV 1&2 Ab, 4th Generation: NONREACTIVE

## 2015-07-26 ENCOUNTER — Telehealth: Payer: Self-pay | Admitting: *Deleted

## 2015-07-26 LAB — GC/CHLAMYDIA PROBE AMP
CT PROBE, AMP APTIMA: NEGATIVE
GC Probe RNA: NEGATIVE

## 2015-07-26 LAB — HSV(HERPES SIMPLEX VRS) I + II AB-IGG

## 2015-07-26 LAB — RPR

## 2015-07-26 NOTE — Telephone Encounter (Signed)
Contacted patient and let him know of these negative results.   He would like letter as well.  Will send.

## 2015-07-26 NOTE — Telephone Encounter (Signed)
Justin Archer--  Pt is calling about his lab results that he had done earlier this week.  Please advise on these results.  Thank you.

## 2016-02-04 DIAGNOSIS — H6123 Impacted cerumen, bilateral: Secondary | ICD-10-CM | POA: Diagnosis not present

## 2016-02-04 DIAGNOSIS — H9313 Tinnitus, bilateral: Secondary | ICD-10-CM | POA: Diagnosis not present

## 2017-10-06 DIAGNOSIS — E785 Hyperlipidemia, unspecified: Secondary | ICD-10-CM | POA: Diagnosis not present

## 2017-10-06 DIAGNOSIS — E291 Testicular hypofunction: Secondary | ICD-10-CM | POA: Diagnosis not present

## 2017-10-06 DIAGNOSIS — E039 Hypothyroidism, unspecified: Secondary | ICD-10-CM | POA: Diagnosis not present

## 2017-10-06 DIAGNOSIS — I10 Essential (primary) hypertension: Secondary | ICD-10-CM | POA: Diagnosis not present

## 2017-10-07 DIAGNOSIS — E785 Hyperlipidemia, unspecified: Secondary | ICD-10-CM | POA: Diagnosis not present

## 2017-10-07 DIAGNOSIS — E291 Testicular hypofunction: Secondary | ICD-10-CM | POA: Diagnosis not present

## 2017-10-07 DIAGNOSIS — E039 Hypothyroidism, unspecified: Secondary | ICD-10-CM | POA: Diagnosis not present

## 2017-10-07 DIAGNOSIS — R739 Hyperglycemia, unspecified: Secondary | ICD-10-CM | POA: Diagnosis not present

## 2017-11-24 DIAGNOSIS — J069 Acute upper respiratory infection, unspecified: Secondary | ICD-10-CM | POA: Diagnosis not present

## 2017-11-29 DIAGNOSIS — J111 Influenza due to unidentified influenza virus with other respiratory manifestations: Secondary | ICD-10-CM | POA: Diagnosis not present

## 2017-12-11 DIAGNOSIS — E291 Testicular hypofunction: Secondary | ICD-10-CM | POA: Diagnosis not present

## 2017-12-11 DIAGNOSIS — F411 Generalized anxiety disorder: Secondary | ICD-10-CM | POA: Diagnosis not present

## 2017-12-11 DIAGNOSIS — Z6835 Body mass index (BMI) 35.0-35.9, adult: Secondary | ICD-10-CM | POA: Diagnosis not present

## 2017-12-21 DIAGNOSIS — E291 Testicular hypofunction: Secondary | ICD-10-CM | POA: Diagnosis not present

## 2018-04-13 DIAGNOSIS — M5136 Other intervertebral disc degeneration, lumbar region: Secondary | ICD-10-CM | POA: Diagnosis not present

## 2018-04-13 DIAGNOSIS — M9905 Segmental and somatic dysfunction of pelvic region: Secondary | ICD-10-CM | POA: Diagnosis not present

## 2018-04-13 DIAGNOSIS — M9902 Segmental and somatic dysfunction of thoracic region: Secondary | ICD-10-CM | POA: Diagnosis not present

## 2018-04-13 DIAGNOSIS — M9903 Segmental and somatic dysfunction of lumbar region: Secondary | ICD-10-CM | POA: Diagnosis not present

## 2018-04-20 DIAGNOSIS — M5136 Other intervertebral disc degeneration, lumbar region: Secondary | ICD-10-CM | POA: Diagnosis not present

## 2018-04-20 DIAGNOSIS — M9902 Segmental and somatic dysfunction of thoracic region: Secondary | ICD-10-CM | POA: Diagnosis not present

## 2018-04-20 DIAGNOSIS — M9905 Segmental and somatic dysfunction of pelvic region: Secondary | ICD-10-CM | POA: Diagnosis not present

## 2018-04-20 DIAGNOSIS — M9903 Segmental and somatic dysfunction of lumbar region: Secondary | ICD-10-CM | POA: Diagnosis not present

## 2018-04-26 DIAGNOSIS — M9905 Segmental and somatic dysfunction of pelvic region: Secondary | ICD-10-CM | POA: Diagnosis not present

## 2018-04-26 DIAGNOSIS — M9903 Segmental and somatic dysfunction of lumbar region: Secondary | ICD-10-CM | POA: Diagnosis not present

## 2018-04-26 DIAGNOSIS — M9902 Segmental and somatic dysfunction of thoracic region: Secondary | ICD-10-CM | POA: Diagnosis not present

## 2018-04-26 DIAGNOSIS — M5136 Other intervertebral disc degeneration, lumbar region: Secondary | ICD-10-CM | POA: Diagnosis not present

## 2018-05-07 DIAGNOSIS — M9903 Segmental and somatic dysfunction of lumbar region: Secondary | ICD-10-CM | POA: Diagnosis not present

## 2018-05-07 DIAGNOSIS — M5136 Other intervertebral disc degeneration, lumbar region: Secondary | ICD-10-CM | POA: Diagnosis not present

## 2018-05-07 DIAGNOSIS — M9905 Segmental and somatic dysfunction of pelvic region: Secondary | ICD-10-CM | POA: Diagnosis not present

## 2018-05-07 DIAGNOSIS — M9902 Segmental and somatic dysfunction of thoracic region: Secondary | ICD-10-CM | POA: Diagnosis not present

## 2018-05-11 DIAGNOSIS — M9905 Segmental and somatic dysfunction of pelvic region: Secondary | ICD-10-CM | POA: Diagnosis not present

## 2018-05-11 DIAGNOSIS — M9903 Segmental and somatic dysfunction of lumbar region: Secondary | ICD-10-CM | POA: Diagnosis not present

## 2018-05-11 DIAGNOSIS — M9902 Segmental and somatic dysfunction of thoracic region: Secondary | ICD-10-CM | POA: Diagnosis not present

## 2018-05-11 DIAGNOSIS — M5136 Other intervertebral disc degeneration, lumbar region: Secondary | ICD-10-CM | POA: Diagnosis not present

## 2018-05-13 DIAGNOSIS — M9902 Segmental and somatic dysfunction of thoracic region: Secondary | ICD-10-CM | POA: Diagnosis not present

## 2018-05-13 DIAGNOSIS — M5136 Other intervertebral disc degeneration, lumbar region: Secondary | ICD-10-CM | POA: Diagnosis not present

## 2018-05-13 DIAGNOSIS — M9905 Segmental and somatic dysfunction of pelvic region: Secondary | ICD-10-CM | POA: Diagnosis not present

## 2018-05-13 DIAGNOSIS — M9903 Segmental and somatic dysfunction of lumbar region: Secondary | ICD-10-CM | POA: Diagnosis not present

## 2018-06-22 DIAGNOSIS — M9903 Segmental and somatic dysfunction of lumbar region: Secondary | ICD-10-CM | POA: Diagnosis not present

## 2018-06-22 DIAGNOSIS — M9905 Segmental and somatic dysfunction of pelvic region: Secondary | ICD-10-CM | POA: Diagnosis not present

## 2018-06-22 DIAGNOSIS — M9902 Segmental and somatic dysfunction of thoracic region: Secondary | ICD-10-CM | POA: Diagnosis not present

## 2018-06-22 DIAGNOSIS — M545 Low back pain: Secondary | ICD-10-CM | POA: Diagnosis not present

## 2018-06-29 DIAGNOSIS — M9903 Segmental and somatic dysfunction of lumbar region: Secondary | ICD-10-CM | POA: Diagnosis not present

## 2018-06-29 DIAGNOSIS — M9902 Segmental and somatic dysfunction of thoracic region: Secondary | ICD-10-CM | POA: Diagnosis not present

## 2018-06-29 DIAGNOSIS — M9905 Segmental and somatic dysfunction of pelvic region: Secondary | ICD-10-CM | POA: Diagnosis not present

## 2018-06-29 DIAGNOSIS — M545 Low back pain: Secondary | ICD-10-CM | POA: Diagnosis not present

## 2018-07-27 DIAGNOSIS — Z6838 Body mass index (BMI) 38.0-38.9, adult: Secondary | ICD-10-CM | POA: Diagnosis not present

## 2018-07-27 DIAGNOSIS — F411 Generalized anxiety disorder: Secondary | ICD-10-CM | POA: Diagnosis not present

## 2018-07-27 DIAGNOSIS — E291 Testicular hypofunction: Secondary | ICD-10-CM | POA: Diagnosis not present

## 2018-07-27 DIAGNOSIS — Z1339 Encounter for screening examination for other mental health and behavioral disorders: Secondary | ICD-10-CM | POA: Diagnosis not present

## 2018-09-02 DIAGNOSIS — Z6839 Body mass index (BMI) 39.0-39.9, adult: Secondary | ICD-10-CM | POA: Diagnosis not present

## 2018-09-02 DIAGNOSIS — F411 Generalized anxiety disorder: Secondary | ICD-10-CM | POA: Diagnosis not present

## 2018-09-02 DIAGNOSIS — Z2821 Immunization not carried out because of patient refusal: Secondary | ICD-10-CM | POA: Diagnosis not present

## 2018-09-02 DIAGNOSIS — G4710 Hypersomnia, unspecified: Secondary | ICD-10-CM | POA: Diagnosis not present

## 2020-11-03 ENCOUNTER — Emergency Department (HOSPITAL_COMMUNITY): Payer: BC Managed Care – PPO

## 2020-11-03 ENCOUNTER — Other Ambulatory Visit: Payer: Self-pay

## 2020-11-03 ENCOUNTER — Inpatient Hospital Stay (HOSPITAL_COMMUNITY)
Admission: EM | Admit: 2020-11-03 | Discharge: 2020-11-13 | DRG: 177 | Disposition: A | Discharge status: Home / Self Care | Payer: BC Managed Care – PPO | Attending: Internal Medicine | Admitting: Internal Medicine

## 2020-11-03 DIAGNOSIS — I10 Essential (primary) hypertension: Secondary | ICD-10-CM | POA: Diagnosis present

## 2020-11-03 DIAGNOSIS — E871 Hypo-osmolality and hyponatremia: Secondary | ICD-10-CM | POA: Diagnosis not present

## 2020-11-03 DIAGNOSIS — J9601 Acute respiratory failure with hypoxia: Secondary | ICD-10-CM | POA: Diagnosis not present

## 2020-11-03 DIAGNOSIS — U071 COVID-19: Principal | ICD-10-CM | POA: Diagnosis present

## 2020-11-03 DIAGNOSIS — R7989 Other specified abnormal findings of blood chemistry: Secondary | ICD-10-CM | POA: Diagnosis present

## 2020-11-03 DIAGNOSIS — E869 Volume depletion, unspecified: Secondary | ICD-10-CM | POA: Diagnosis present

## 2020-11-03 DIAGNOSIS — R7401 Elevation of levels of liver transaminase levels: Secondary | ICD-10-CM | POA: Diagnosis present

## 2020-11-03 DIAGNOSIS — Z6841 Body Mass Index (BMI) 40.0 and over, adult: Secondary | ICD-10-CM

## 2020-11-03 DIAGNOSIS — F411 Generalized anxiety disorder: Secondary | ICD-10-CM | POA: Diagnosis not present

## 2020-11-03 DIAGNOSIS — Z79899 Other long term (current) drug therapy: Secondary | ICD-10-CM | POA: Diagnosis not present

## 2020-11-03 DIAGNOSIS — I517 Cardiomegaly: Secondary | ICD-10-CM | POA: Diagnosis not present

## 2020-11-03 DIAGNOSIS — E041 Nontoxic single thyroid nodule: Secondary | ICD-10-CM | POA: Diagnosis present

## 2020-11-03 DIAGNOSIS — R0902 Hypoxemia: Secondary | ICD-10-CM

## 2020-11-03 DIAGNOSIS — J1282 Pneumonia due to coronavirus disease 2019: Secondary | ICD-10-CM | POA: Diagnosis not present

## 2020-11-03 DIAGNOSIS — K449 Diaphragmatic hernia without obstruction or gangrene: Secondary | ICD-10-CM | POA: Diagnosis not present

## 2020-11-03 DIAGNOSIS — E291 Testicular hypofunction: Secondary | ICD-10-CM | POA: Diagnosis not present

## 2020-11-03 DIAGNOSIS — Z91041 Radiographic dye allergy status: Secondary | ICD-10-CM

## 2020-11-03 DIAGNOSIS — J189 Pneumonia, unspecified organism: Secondary | ICD-10-CM | POA: Diagnosis not present

## 2020-11-03 DIAGNOSIS — R918 Other nonspecific abnormal finding of lung field: Secondary | ICD-10-CM | POA: Diagnosis not present

## 2020-11-03 LAB — RESP PANEL BY RT-PCR (FLU A&B, COVID) ARPGX2
Influenza A by PCR: NEGATIVE
Influenza B by PCR: NEGATIVE
SARS Coronavirus 2 by RT PCR: POSITIVE — AB

## 2020-11-03 LAB — FIBRINOGEN: Fibrinogen: 628 mg/dL — ABNORMAL HIGH (ref 210–475)

## 2020-11-03 LAB — FERRITIN: Ferritin: 525 ng/mL — ABNORMAL HIGH (ref 24–336)

## 2020-11-03 LAB — CBC WITH DIFFERENTIAL/PLATELET
Abs Immature Granulocytes: 0.08 10*3/uL — ABNORMAL HIGH (ref 0.00–0.07)
Basophils Absolute: 0 10*3/uL (ref 0.0–0.1)
Basophils Relative: 0 %
Eosinophils Absolute: 0 10*3/uL (ref 0.0–0.5)
Eosinophils Relative: 0 %
HCT: 46.9 % (ref 39.0–52.0)
Hemoglobin: 16.3 g/dL (ref 13.0–17.0)
Immature Granulocytes: 2 %
Lymphocytes Relative: 17 %
Lymphs Abs: 0.9 10*3/uL (ref 0.7–4.0)
MCH: 29.5 pg (ref 26.0–34.0)
MCHC: 34.8 g/dL (ref 30.0–36.0)
MCV: 85 fL (ref 80.0–100.0)
Monocytes Absolute: 0.2 10*3/uL (ref 0.1–1.0)
Monocytes Relative: 4 %
Neutro Abs: 4.2 10*3/uL (ref 1.7–7.7)
Neutrophils Relative %: 77 %
Platelets: 172 10*3/uL (ref 150–400)
RBC: 5.52 MIL/uL (ref 4.22–5.81)
RDW: 13.1 % (ref 11.5–15.5)
WBC: 5.4 10*3/uL (ref 4.0–10.5)
nRBC: 0 % (ref 0.0–0.2)

## 2020-11-03 LAB — D-DIMER, QUANTITATIVE: D-Dimer, Quant: 1.03 ug/mL-FEU — ABNORMAL HIGH (ref 0.00–0.50)

## 2020-11-03 LAB — TRIGLYCERIDES: Triglycerides: 145 mg/dL (ref <150)

## 2020-11-03 LAB — COMPREHENSIVE METABOLIC PANEL
ALT: 49 U/L — ABNORMAL HIGH (ref 0–44)
AST: 94 U/L — ABNORMAL HIGH (ref 15–41)
Albumin: 3.2 g/dL — ABNORMAL LOW (ref 3.5–5.0)
Alkaline Phosphatase: 44 U/L (ref 38–126)
Anion gap: 12 (ref 5–15)
BUN: 7 mg/dL (ref 6–20)
CO2: 23 mmol/L (ref 22–32)
Calcium: 8.5 mg/dL — ABNORMAL LOW (ref 8.9–10.3)
Chloride: 98 mmol/L (ref 98–111)
Creatinine, Ser: 0.86 mg/dL (ref 0.61–1.24)
GFR, Estimated: >60 mL/min (ref >60)
Glucose, Bld: 99 mg/dL (ref 70–99)
Potassium: 4 mmol/L (ref 3.5–5.1)
Sodium: 133 mmol/L — ABNORMAL LOW (ref 135–145)
Total Bilirubin: 1.2 mg/dL (ref 0.3–1.2)
Total Protein: 6.1 g/dL — ABNORMAL LOW (ref 6.5–8.1)

## 2020-11-03 LAB — C-REACTIVE PROTEIN: CRP: 12 mg/dL — ABNORMAL HIGH (ref <1.0)

## 2020-11-03 LAB — PROCALCITONIN: Procalcitonin: <0.1 ng/mL

## 2020-11-03 LAB — LACTIC ACID, PLASMA: Lactic Acid, Venous: 0.9 mmol/L (ref 0.5–1.9)

## 2020-11-03 LAB — LACTATE DEHYDROGENASE: LDH: 544 U/L — ABNORMAL HIGH (ref 98–192)

## 2020-11-03 LAB — POC SARS CORONAVIRUS 2 AG -  ED: SARS Coronavirus 2 Ag: NEGATIVE

## 2020-11-03 MED ORDER — GUAIFENESIN-CODEINE 100-10 MG/5ML PO SOLN
10.0000 mL | Freq: Four times a day (QID) | ORAL | Status: DC | PRN
  Administered 2020-11-04 – 2020-11-13 (×13): 10 mL via ORAL
  Filled 2020-11-03 (×13): qty 10

## 2020-11-03 MED ORDER — SODIUM CHLORIDE 0.9 % IV SOLN
200.0000 mg | Freq: Once | INTRAVENOUS | Status: AC
  Administered 2020-11-03: 200 mg via INTRAVENOUS
  Filled 2020-11-03: qty 40

## 2020-11-03 MED ORDER — SODIUM CHLORIDE 0.9 % IV SOLN
100.0000 mg | Freq: Every day | INTRAVENOUS | Status: AC
  Administered 2020-11-04 – 2020-11-07 (×4): 100 mg via INTRAVENOUS
  Filled 2020-11-03: qty 100
  Filled 2020-11-03: qty 20
  Filled 2020-11-03 (×2): qty 100

## 2020-11-03 MED ORDER — DEXAMETHASONE SODIUM PHOSPHATE 10 MG/ML IJ SOLN
10.0000 mg | Freq: Once | INTRAMUSCULAR | Status: AC
  Administered 2020-11-03: 10 mg via INTRAVENOUS
  Filled 2020-11-03: qty 1

## 2020-11-03 MED ORDER — ACETAMINOPHEN 325 MG PO TABS
650.0000 mg | ORAL_TABLET | ORAL | Status: DC | PRN
  Administered 2020-11-04: 650 mg via ORAL
  Filled 2020-11-03: qty 2

## 2020-11-03 NOTE — ED Notes (Signed)
Pt would like to rest and states he will ambulate later.

## 2020-11-03 NOTE — ED Notes (Signed)
Paged attending for RN 

## 2020-11-03 NOTE — ED Triage Notes (Signed)
Pt came by personal vehicle after being seen at urgent care. Pt states urgent care told him he has COVID pneumonia. Pt is axox4.

## 2020-11-03 NOTE — ED Notes (Signed)
Pt increased to 4L

## 2020-11-03 NOTE — ED Provider Notes (Signed)
[facility] [place] [facility] EMERGENCY DEPARTMENT Provider Note   CSN: [id number] Arrival date & time: 11/03/20  1706     History Chief Complaint  Patient presents with  . Covid Positive    Unnamed P Justin Archer is a 40 y.o. male with history of hypertension presents to the ED from urgent care for evaluation of Covid symptoms.  Patient reports developing Covid symptoms on 12/23.  Reports fever as high as 102, myalgias that were "severe" for about 3 to 4 days.  These seem to ease up but then 4 days ago he developed severe dry cough.  Reports 20 minutes episodes of coughing fit that he cannot control and make him really winded.  Last night he checked his pulse oximeter and it was 85%.  He felt like he could wait until the next morning to see if he needed to go to the hospital.  He went to urgent care earlier today and was instructed to come to the ED for further evaluation due to low oxygen levels.  He is unvaccinated.  No chest pain.  Denies congestion, sore throat, change in taste or smell, nausea, vomiting, abdominal pain, diarrhea.  Denies history of PE or DVTs.  Denies leg swelling or calf pain.  Denies recent surgery, prolonged immobilization, hemoptysis.  He uses testosterone IM every 10 days.  Does not use oxygen at home.   HPI     Past Medical History:  Diagnosis Date  . Hypertension     Patient Active Problem List   Diagnosis Date Noted  . COVID-19 virus infection 11/03/2020  . Laceration of left thumb with tendon involvement 04/20/2015    Past Surgical History:  Procedure Laterality Date  . I & D EXTREMITY Left 04/20/2015   Procedure: IRRIGATION AND DEBRIDEMENT LEFT THUMB WITH REPAIR EPL TENDON;  Surgeon: Roseanne Kaufman, MD;  Location: [place];  Service: Orthopedics;  Laterality: Left;  . PERCUTANEOUS PINNING Left 04/20/2015   Procedure:  IP PINNING;  Surgeon: Roseanne Kaufman, MD;  Location: [place];  Service: Orthopedics;  Laterality: Left;       No family history on  file.  Social History   Tobacco Use  . Smoking status: Never Smoker  Substance Use Topics  . Alcohol use: No    Alcohol/week: 0.0 standard drinks  . Drug use: No    Home Medications Prior to Admission medications   Medication Sig Start Date End Date Taking? Authorizing Provider  cephALEXin (KEFLEX) 500 MG capsule Take 1 capsule (500 mg total) by mouth 4 (four) times daily. Patient not taking: Reported on 07/24/2015 04/21/15   Avelina Laine, PA-C  oxyCODONE (OXY IR/ROXICODONE) 5 MG immediate release tablet Take 1-2 tablets (5-10 mg total) by mouth every 3 (three) hours as needed for moderate pain. Patient not taking: Reported on 07/24/2015 04/21/15   Avelina Laine, PA-C  sildenafil (VIAGRA) 100 MG tablet Use as directed for erectile dysfunction 08/09/13   Posey Boyer, MD    Allergies    Iodine  Review of Systems   Review of Systems  Constitutional: Positive for chills and fever.  Respiratory: Positive for cough and shortness of breath.   Musculoskeletal: Positive for arthralgias and myalgias.  All other systems reviewed and are negative.   Physical Exam Updated Vital Signs BP 127/70   Pulse (!) 111   Temp 99.1 F (37.3 C) (Oral)   Resp (!) 36   SpO2 (!) 86%   Physical Exam Vitals and nursing note reviewed.  Constitutional:  General: He is not in acute distress.    Appearance: He is well-developed and well-nourished.     Comments: NAD.  HENT:     Head: Normocephalic and atraumatic.     Right Ear: External ear normal.     Left Ear: External ear normal.     Nose: Nose normal.  Eyes:     General: No scleral icterus.    Extraocular Movements: EOM normal.     Conjunctiva/sclera: Conjunctivae normal.  Cardiovascular:     Rate and Rhythm: Normal rate and regular rhythm.     Pulses: Intact distal pulses.     Heart sounds: Normal heart sounds. No murmur heard.     Comments: No lower extremity edema.  No calf tenderness. Pulmonary:     Breath sounds:  Wheezing and rhonchi present.     Comments: SPO2 85% on room air when I enter the room.  Patient was placed on 3 L [employer] with improvement in SPO2 greater than 90%.  Does not appear significantly dyspneic however.  Full sentences.  Diminished lung sounds in lower lobes.  Expiratory rhonchi/wheezing upper/middle lobes.  Dry cough during exam. Abdominal:     Palpations: Abdomen is soft.     Tenderness: There is no abdominal tenderness.  Musculoskeletal:        General: No deformity. Normal range of motion.     Cervical back: Normal range of motion and neck supple.  Skin:    General: Skin is warm and dry.     Capillary Refill: Capillary refill takes less than 2 seconds.     Comments: Feels warm to touch.  Neurological:     Mental Status: He is alert and oriented to person, place, and time.  Psychiatric:        Mood and Affect: Mood and affect normal.        Behavior: Behavior normal.        Thought Content: Thought content normal.        Judgment: Judgment normal.     ED Results / Procedures / Treatments   Labs (all labs ordered are listed, but only abnormal results are displayed) Labs Reviewed  CBC WITH DIFFERENTIAL/PLATELET - Abnormal; Notable for the following components:      Result Value   Abs Immature Granulocytes 0.08 (*)    All other components within normal limits  COMPREHENSIVE METABOLIC PANEL - Abnormal; Notable for the following components:   Sodium 133 (*)    Calcium 8.5 (*)    Total Protein 6.1 (*)    Albumin 3.2 (*)    AST 94 (*)    ALT 49 (*)    All other components within normal limits  D-DIMER, QUANTITATIVE (NOT AT [facility]) - Abnormal; Notable for the following components:   D-Dimer, Quant 1.03 (*)    All other components within normal limits  LACTATE DEHYDROGENASE - Abnormal; Notable for the following components:   LDH 544 (*)    All other components within normal limits  FERRITIN - Abnormal; Notable for the following components:   Ferritin 525 (*)    All other  components within normal limits  FIBRINOGEN - Abnormal; Notable for the following components:   Fibrinogen 628 (*)    All other components within normal limits  C-REACTIVE PROTEIN - Abnormal; Notable for the following components:   CRP 12.0 (*)    All other components within normal limits  RESP PANEL BY RT-PCR (FLU A&B, COVID) ARPGX2  CULTURE, BLOOD (ROUTINE X 2)  CULTURE,  BLOOD (ROUTINE X 2)  LACTIC ACID, PLASMA  PROCALCITONIN  TRIGLYCERIDES  LACTIC ACID, PLASMA  POC SARS CORONAVIRUS 2 AG -  ED    EKG None  Radiology DG Chest Port 1 View  Result Date: 11/03/2020 CLINICAL DATA:  Focal positive EXAM: PORTABLE CHEST 1 VIEW COMPARISON:  None. FINDINGS: The heart size and mediastinal contours are mildly enlarged. Extensive multifocal patchy airspace opacity seen throughout both lungs, left greater than right. No pleural effusion. No acute osseous abnormality. IMPRESSION: Extensive airspace opacities, consistent with multifocal pneumonia Electronically Signed   By: Jonna Clark M.D.   On: 11/03/2020 19:20    Procedures Procedures (including critical care time)  Medications Ordered in ED Medications  remdesivir 200 mg in sodium chloride 0.9% 250 mL IVPB (0 mg Intravenous Stopped 11/03/20 2210)    Followed by  remdesivir 100 mg in sodium chloride 0.9 % 100 mL IVPB (has no administration in time range)  dexamethasone (DECADRON) injection 10 mg (10 mg Intravenous Given 11/03/20 2135)    ED Course  I have reviewed the triage vital signs and the nursing notes.  Pertinent labs & imaging results that were available during my care of the patient were reviewed by me and considered in my medical decision making (see chart for details).    MDM Rules/Calculators/A&P                           40 y.o. yo with chief complaint of cough and low oxygen.  Known COVID positive 9 days ago at OSH.  Sent to ER for UC.  Previous medical records available, triage and nursing notes reviewed to obtain  more history and assist with MDM  Chief complain involves an extensive number of treatment options and is a complaint that carries with it a high risk of complications and morbidity and mortality.    Differential diagnosis: Hypoxia due to Covid illness.  Has no chest pain and actually reports no shortness of breath, PE less likely.  ER lab work and imaging ordered by triage RN and me, as above  I have personally visualized and interpreted ER diagnostic work up including labs and imaging.    Labs reveal -POC Covid negative, pending PCR respiratory panel.  Inflammatory markers elevated.  D-dimer 1.03. No CP, doubt ACS and no need for trop, BNP.   Imaging reveals -chest x-ray shows extensive airspace opacities.  EKG shows sinus tachycardia HR 111.  Medications ordered -Decadron, remdesivir  Ordered continuous cardiac and pulse ox monitoring.  Will plan for serial re-examinations. Close monitoring.   Re-evaluated the patient.   Stable on 2 L [place].  Does not appear to be significantly dyspneic clinically however SPO2 84-85% on room air on arrival.  Consults made in the ED: hospitalist   Critical interventions in the ED: admission for hypoxia, supplemental oxygen.   Final Clinical Impression(s) / ED Diagnoses Final diagnoses:  Hypoxia    Rx / DC Orders ED Discharge Orders    None       Jerrell Mylar 11/03/20 2315    Cheryll Cockayne, MD 11/03/20 2324

## 2020-11-04 ENCOUNTER — Encounter (HOSPITAL_COMMUNITY): Payer: Self-pay | Admitting: Internal Medicine

## 2020-11-04 ENCOUNTER — Inpatient Hospital Stay (HOSPITAL_COMMUNITY): Payer: BC Managed Care – PPO

## 2020-11-04 DIAGNOSIS — I10 Essential (primary) hypertension: Secondary | ICD-10-CM | POA: Diagnosis present

## 2020-11-04 DIAGNOSIS — J9601 Acute respiratory failure with hypoxia: Secondary | ICD-10-CM | POA: Diagnosis present

## 2020-11-04 DIAGNOSIS — R0902 Hypoxemia: Secondary | ICD-10-CM

## 2020-11-04 DIAGNOSIS — U071 COVID-19: Principal | ICD-10-CM

## 2020-11-04 DIAGNOSIS — R7989 Other specified abnormal findings of blood chemistry: Secondary | ICD-10-CM | POA: Diagnosis present

## 2020-11-04 DIAGNOSIS — F411 Generalized anxiety disorder: Secondary | ICD-10-CM | POA: Diagnosis present

## 2020-11-04 LAB — LACTIC ACID, PLASMA: Lactic Acid, Venous: 1.1 mmol/L (ref 0.5–1.9)

## 2020-11-04 LAB — HIV ANTIBODY (ROUTINE TESTING W REFLEX): HIV Screen 4th Generation wRfx: NONREACTIVE

## 2020-11-04 MED ORDER — HYDROCORTISONE NA SUCCINATE PF 100 MG IJ SOLR
200.0000 mg | Freq: Once | INTRAMUSCULAR | Status: AC
  Administered 2020-11-04: 200 mg via INTRAVENOUS
  Filled 2020-11-04: qty 4

## 2020-11-04 MED ORDER — PAROXETINE HCL 10 MG PO TABS
10.0000 mg | ORAL_TABLET | Freq: Every day | ORAL | Status: DC
  Administered 2020-11-05 – 2020-11-11 (×7): 10 mg via ORAL
  Filled 2020-11-04 (×7): qty 1

## 2020-11-04 MED ORDER — ALBUTEROL SULFATE HFA 108 (90 BASE) MCG/ACT IN AERS
2.0000 | INHALATION_SPRAY | RESPIRATORY_TRACT | Status: DC | PRN
  Administered 2020-11-05 (×2): 2 via RESPIRATORY_TRACT
  Filled 2020-11-04: qty 6.7

## 2020-11-04 MED ORDER — ONDANSETRON HCL 4 MG PO TABS: 4.0000 mg | ORAL_TABLET | Freq: Four times a day (QID) | ORAL | Status: DC | PRN

## 2020-11-04 MED ORDER — PAROXETINE HCL 10 MG PO TABS
5.0000 mg | ORAL_TABLET | Freq: Once | ORAL | Status: AC
  Administered 2020-11-04: 5 mg via ORAL
  Filled 2020-11-04: qty 0.5

## 2020-11-04 MED ORDER — DIPHENHYDRAMINE HCL 25 MG PO CAPS: 50.0000 mg | ORAL_CAPSULE | Freq: Once | ORAL | Status: AC

## 2020-11-04 MED ORDER — TOCILIZUMAB 400 MG/20ML IV SOLN
800.0000 mg | Freq: Once | INTRAVENOUS | Status: AC
  Administered 2020-11-04: 800 mg via INTRAVENOUS
  Filled 2020-11-04: qty 40

## 2020-11-04 MED ORDER — GUAIFENESIN-CODEINE 100-10 MG/5ML PO SOLN
10.0000 mL | Freq: Once | ORAL | Status: AC
  Administered 2020-11-04: 10 mL via ORAL
  Filled 2020-11-04: qty 10

## 2020-11-04 MED ORDER — IOHEXOL 350 MG/ML SOLN
75.0000 mL | Freq: Once | INTRAVENOUS | Status: AC | PRN
  Administered 2020-11-04: 75 mL via INTRAVENOUS

## 2020-11-04 MED ORDER — LACTATED RINGERS IV SOLN: INTRAVENOUS | Status: AC

## 2020-11-04 MED ORDER — ZINC SULFATE 220 (50 ZN) MG PO CAPS
220.0000 mg | ORAL_CAPSULE | Freq: Every day | ORAL | Status: DC
  Administered 2020-11-04 – 2020-11-13 (×10): 220 mg via ORAL
  Filled 2020-11-04 (×9): qty 1

## 2020-11-04 MED ORDER — DIPHENHYDRAMINE HCL 50 MG/ML IJ SOLN
50.0000 mg | Freq: Once | INTRAMUSCULAR | Status: AC
  Administered 2020-11-04: 50 mg via INTRAVENOUS
  Filled 2020-11-04: qty 1

## 2020-11-04 MED ORDER — PAROXETINE HCL 10 MG PO TABS
5.0000 mg | ORAL_TABLET | Freq: Every evening | ORAL | Status: DC
  Administered 2020-11-04: 5 mg via ORAL
  Filled 2020-11-04: qty 0.5

## 2020-11-04 MED ORDER — HYDRALAZINE HCL 20 MG/ML IJ SOLN
10.0000 mg | Freq: Four times a day (QID) | INTRAMUSCULAR | Status: DC | PRN
  Administered 2020-11-04: 10 mg via INTRAVENOUS
  Filled 2020-11-04: qty 1

## 2020-11-04 MED ORDER — HYDROCORTISONE NA SUCCINATE PF 250 MG IJ SOLR
200.0000 mg | Freq: Once | INTRAMUSCULAR | Status: DC
  Filled 2020-11-04: qty 200

## 2020-11-04 MED ORDER — POLYETHYLENE GLYCOL 3350 17 G PO PACK
17.0000 g | PACK | Freq: Every day | ORAL | Status: DC | PRN
  Administered 2020-11-05 – 2020-11-12 (×7): 17 g via ORAL
  Filled 2020-11-04 (×8): qty 1

## 2020-11-04 MED ORDER — DEXAMETHASONE SODIUM PHOSPHATE 10 MG/ML IJ SOLN: 6.0000 mg | INTRAMUSCULAR | Status: DC

## 2020-11-04 MED ORDER — LACTATED RINGERS IV BOLUS
2000.0000 mL | Freq: Once | INTRAVENOUS | Status: AC
  Administered 2020-11-04: 2000 mL via INTRAVENOUS

## 2020-11-04 MED ORDER — ONDANSETRON HCL 4 MG/2ML IJ SOLN: 4.0000 mg | Freq: Four times a day (QID) | INTRAMUSCULAR | Status: DC | PRN

## 2020-11-04 MED ORDER — ENOXAPARIN SODIUM 40 MG/0.4ML ~~LOC~~ SOLN
40.0000 mg | SUBCUTANEOUS | Status: DC
  Administered 2020-11-04 – 2020-11-12 (×9): 40 mg via SUBCUTANEOUS
  Filled 2020-11-04 (×10): qty 0.4

## 2020-11-04 MED ORDER — SODIUM CHLORIDE 0.9 % IV SOLN: 180.0000 mg | Freq: Two times a day (BID) | INTRAVENOUS | Status: DC

## 2020-11-04 MED ORDER — ASCORBIC ACID 500 MG PO TABS
500.0000 mg | ORAL_TABLET | Freq: Every day | ORAL | Status: DC
  Administered 2020-11-04 – 2020-11-13 (×10): 500 mg via ORAL
  Filled 2020-11-04 (×11): qty 1

## 2020-11-04 MED ORDER — SODIUM CHLORIDE 0.9 % IV SOLN
160.0000 mg | Freq: Two times a day (BID) | INTRAVENOUS | Status: DC
  Administered 2020-11-04 – 2020-11-08 (×9): 160 mg via INTRAVENOUS
  Filled 2020-11-04 (×10): qty 1.28

## 2020-11-04 NOTE — H&P (Signed)
History and Physical    Justin Archer ZJI:[id number] DOB: [birth year] DOA: 11/03/2020  PCP: [facility]  Patient coming from: Local urgent care clinic   Chief Complaint:  Chief Complaint  Patient presents with  . Covid Positive     HPI:    40 year old male with past medical history of hypertension, generalized anxiety disorder, hypogonadism who presented to [facility] after being sent over from a local urgent care clinic due to hypoxia.  Patient explains that approximately 3 days prior to Christmas he began to develop generalized weakness muscle aches and intermittent fevers.  The symptoms persisted for the next several days and the patient eventually presented to a local urgent care clinic where he was diagnosed with Covid on 12/24.  In the days that followed, patient began to develop a cough.  Patient's cough was initially mild but progressively became more more severe over the next several days.  Patient also began to develop some associated shortness of breath.  Due to progressively worsening generalized weakness, malaise cough and fevers patient eventually presented again to a local urgent care clinic on 1/1 for repeat evaluation.  Upon evaluation patient was found to be hypoxic into the 80s and was therefore sent to [facility] emergency department for evaluation.  Upon evaluation in the emergency department patient is confirmed to be hypoxic and was placed on submental oxygen initially on nasal cannula.  Initiated on intravenous Decadron and remdesivir.  The hospitalist group was then called to assess the patient for admission to the hospital.  Review of Systems:   Review of Systems  Constitutional: Positive for fever and malaise/fatigue.  Respiratory: Positive for cough and shortness of breath.   Neurological: Positive for weakness.    Past Medical History:  Diagnosis Date  . Hypertension     Past Surgical History:  Procedure  Laterality Date  . I & D EXTREMITY Left 04/20/2015   Procedure: IRRIGATION AND DEBRIDEMENT LEFT THUMB WITH REPAIR EPL TENDON;  Surgeon: Roseanne Kaufman, MD;  Location: [place];  Service: Orthopedics;  Laterality: Left;  . PERCUTANEOUS PINNING Left 04/20/2015   Procedure:  IP PINNING;  Surgeon: Roseanne Kaufman, MD;  Location: [place];  Service: Orthopedics;  Laterality: Left;     reports that he has never smoked. He has never used smokeless tobacco. He reports that he does not drink alcohol and does not use drugs.  Allergies  Allergen Reactions  . Iodine     Family History  Problem Relation Age of Onset  . Lung cancer Neg Hx      Prior to Admission medications   Medication Sig Start Date End Date Taking? Authorizing Provider  cephALEXin (KEFLEX) 500 MG capsule Take 1 capsule (500 mg total) by mouth 4 (four) times daily. Patient not taking: Reported on 07/24/2015 04/21/15   Avelina Laine, PA-C  oxyCODONE (OXY IR/ROXICODONE) 5 MG immediate release tablet Take 1-2 tablets (5-10 mg total) by mouth every 3 (three) hours as needed for moderate pain. Patient not taking: Reported on 07/24/2015 04/21/15   Avelina Laine, PA-C  sildenafil (VIAGRA) 100 MG tablet Use as directed for erectile dysfunction 08/09/13   Posey Boyer, MD    Physical Exam: Vitals:   11/03/20 2230 11/03/20 2335 11/04/20 0300 11/04/20 0600  BP: 127/70  121/71 115/64  Pulse: (!) 111  (!) 104 88  Resp: (!) 36  (!) 30 (!) 30  Temp:  100.2 F (37.9 C)    TempSrc:  Oral  SpO2: (!) 86%  91% (!) 87%    Constitutional: Acute alert and oriented x3, patient is in respiratory distress, patient is obese.   Skin: Patient is diaphoretic.  No rashes, no lesions Eyes: Pupils are equally reactive to light.  No evidence of scleral icterus or conjunctival pallor.  ENMT: Dry mucous membranes noted.  Posterior pharynx clear of any exudate or lesions.   Neck: normal, supple, no masses, no thyromegaly.  No evidence of jugular venous  distension.   Respiratory: Scattered rhonchi bilaterally with rales noted in the bilateral mid and lower fields.  No evidence of significant wheezing.  Patient is markedly tachypneic without accessory muscle use.  Cardiovascular: Patient is tachycardic and regular.  No murmurs / rubs / gallops. No extremity edema. 2+ pedal pulses. No carotid bruits.  Chest:   Nontender without crepitus or deformity.   Back:   Nontender without crepitus or deformity. Abdomen: Abdomen is soft and nontender.  No evidence of intra-abdominal masses.  Positive bowel sounds noted in all quadrants.   Musculoskeletal: No joint deformity upper and lower extremities. Good ROM, no contractures. Normal muscle tone.  Neurologic: CN 2-12 grossly intact. Sensation intact.  Patient moving all 4 extremities spontaneously.  Patient is following all commands.  Patient is responsive to verbal stimuli.   Psychiatric: Patient exhibits normal mood with appropriate affect.  Patient seems to possess insight as to their current situation.     Labs on Admission: I have personally reviewed following labs and imaging studies -   CBC: Recent Labs  Lab 11/03/20 2023  WBC 5.4  NEUTROABS 4.2  HGB 16.3  HCT 46.9  MCV 85.0  PLT 172   Basic Metabolic Panel: Recent Labs  Lab 11/03/20 2023  NA 133*  K 4.0  CL 98  CO2 23  GLUCOSE 99  BUN 7  CREATININE 0.86  CALCIUM 8.5*   GFR: CrCl cannot be calculated (Unknown ideal weight.). Liver Function Tests: Recent Labs  Lab 11/03/20 2023  AST 94*  ALT 49*  ALKPHOS 44  BILITOT 1.2  PROT 6.1*  ALBUMIN 3.2*   No results for input(s): LIPASE, AMYLASE in the last 168 hours. No results for input(s): AMMONIA in the last 168 hours. Coagulation Profile: No results for input(s): INR, PROTIME in the last 168 hours. Cardiac Enzymes: No results for input(s): CKTOTAL, CKMB, CKMBINDEX, TROPONINI in the last 168 hours. BNP (last 3 results) No results for input(s): PROBNP in the last 8760  hours. HbA1C: No results for input(s): HGBA1C in the last 72 hours. CBG: No results for input(s): GLUCAP in the last 168 hours. Lipid Profile: Recent Labs    11/03/20 2023  TRIG 145   Thyroid Function Tests: No results for input(s): TSH, T4TOTAL, FREET4, T3FREE, THYROIDAB in the last 72 hours. Anemia Panel: Recent Labs    11/03/20 2023  FERRITIN 525*   Urine analysis: No results found for: COLORURINE, APPEARANCEUR, LABSPEC, PHURINE, GLUCOSEU, HGBUR, BILIRUBINUR, KETONESUR, PROTEINUR, UROBILINOGEN, NITRITE, LEUKOCYTESUR  Radiological Exams on Admission - Personally Reviewed: DG Chest Port 1 View  Result Date: 11/03/2020 CLINICAL DATA:  Focal positive EXAM: PORTABLE CHEST 1 VIEW COMPARISON:  None. FINDINGS: The heart size and mediastinal contours are mildly enlarged. Extensive multifocal patchy airspace opacity seen throughout both lungs, left greater than right. No pleural effusion. No acute osseous abnormality. IMPRESSION: Extensive airspace opacities, consistent with multifocal pneumonia Electronically Signed   By: Jonna Clark M.D.   On: 11/03/2020 19:20    EKG: Personally reviewed.  Rhythm is  sinus tachycardia with heart rate of 111 bpm.  No dynamic ST segment changes appreciated.  Assessment/Plan Active Problems:   COVID-19 virus infection   Patient presenting with nearly 2 weeks of generalized malaise, weakness, intermittent fevers, severe cough and shortness of breath  COVID-19 testing found to be positive on 12/24 and confirmed again upon arrival to the emergency department  Patient exhibiting substantial oxygen requirement via nasal cannula and may need to be transition to high flow nasal cannula before the end of the evening  Treating patient with intravenous dexamethasone and remdesivir  Providing patient with as needed antitussives  Providing patient with as needed bronchodilator therapy  Providing patient with Daily zinc and vitamin C  Admitting patient to  COVID-19 unit    Acute respiratory failure with hypoxia (HCC)   Please see assessment and plan above    Elevated d-dimer   Notable modest elevation of D-dimer of 1.03  This may all be secondary to COVID-19, considering patient's rapidly increasing oxygen requirement here in the emergency department will obtain CT angiogram of the chest.    Essential hypertension   Holding home regimen of antihypertensive therapy due to volume depletion  As needed intravenous antihypertensives for markedly elevated blood pressures.     Generalized anxiety disorder  Continue home regimen of paroxetine    Code Status:  Full code Family Communication: deferred   Status is: Inpatient  Remains inpatient appropriate because:Ongoing diagnostic testing needed not appropriate for outpatient work up, IV treatments appropriate due to intensity of illness or inability to take PO and Inpatient level of care appropriate due to severity of illness   Dispo: The patient is from: Home              Anticipated d/c is to: Home              Anticipated d/c date is: 3 days              Patient currently is not medically stable to d/c.        Marinda Elk MD [facility] Pager [phone]  If 7PM-7AM, please contact night-coverage [email or web address] Use universal [place] password for that web site. If you do not have the password, please call the hospital operator.  11/04/2020, 7:09 AM

## 2020-11-04 NOTE — ED Notes (Signed)
+  tele Breakfast Ordered 

## 2020-11-04 NOTE — Progress Notes (Signed)
   11/04/20 1607  Clinical Encounter Type  Visited With Patient  Visit Type Initial;ED;Spiritual support  Referral From Patient  Spiritual Encounters  Spiritual Needs Prayer   Chaplain responded to a request from the patient. Patient said he wanted someone to 'praise God with,' and it seems his faith is a big source of comfort and encouragement during this time. Chaplain offered listening and celebration with the patient. Chaplain introduced spiritual care services.  Spiritual care services available as needed.   Alda Ponder, Chaplain

## 2020-11-04 NOTE — ED Notes (Signed)
Ordered fan

## 2020-11-04 NOTE — ED Notes (Signed)
Family updated.

## 2020-11-04 NOTE — ED Notes (Signed)
Pt increased to 6L

## 2020-11-04 NOTE — ED Notes (Signed)
Ordered hospital bed 

## 2020-11-04 NOTE — ED Notes (Signed)
Pt's O2 sats drop and HR increases while talking on the phone

## 2020-11-04 NOTE — Progress Notes (Signed)
Justin Archer  KWI:[id number] DOB: Nov 01, 1981 DOA: 11/03/2020 PCP: [facility]    Brief Narrative:  39 year old with a history of HTN, hypogonadism, and generalized anxiety disorder who was sent to [facility] ED from a local urgent care due to hypoxia.  He reports developing generalized weakness muscle aches and intermittent fevers 3 days before Christmas, leaving him to present to an urgent care on 12/24 when he was diagnosed with Covid.  Since that time the patient has developed a severe cough and progressive shortness of breath.  Due to progression of his symptoms he return to the urgent care 1/1 for reevaluation when he was found to be hypoxic in the 80s and sent to the ED.  Significant Events:  12/22 onset of symptoms 12/24 Covid positive at urgent care 1/1 return to urgent care -referred to ED for hypoxia 1/2 admit via [facility] ED  Date of Positive COVID Test:  12/24  Vaccination Status: Not vaccinated  COVID-19 specific Treatment: Remdesivir 1/1 > Decadron 1/1 > Actemra 1/2  Antimicrobials:  None  DVT prophylaxis: Lovenox  Subjective: The patient was interviewed and examined by my partner earlier this morning.  I called and spoke with the patient via his nurses hospital phone.  I discussed treatment options for his worsening hypoxia due to Covid pneumonia.  I explained the mechanism of action of Actemra, our experience using it against Covid pneumonia, and the fact that it is not formally FDA approved for this indication.  I discussed with him the potential side effects of his use.  Based on our discussion he has agreed with Actemra dosing.  Patient examined at bedside later in the day.  He is alert and conversant.  He is somewhat anxious and tachypneic and now on salter high flow nasal cannula.  Apart from his anxiety however he does not appear to be in extremis.  Assessment & Plan:  COVID Pneumonia -acute hypoxic respiratory failure Continue Remdesivir -change  Decadron to Solu-Medrol -dosed with Actemra -explained to patient that he will likely be in the hospital for many days -encouraged him to control his breathing and anxiety to the best of his ability  Recent Labs  Lab 11/03/20 2023  DDIMER 1.03*  FERRITIN 525*  CRP 12.0*  ALT 49*  PROCALCITON <0.10    Elevated D-dimer No PE on CTa chest  Hyponatremia  Mildly elevated transaminases  HTN  Generalized anxiety disorder  2.5 cm right thyroid nodule Incidentally noted on CT chest -ultrasound follow-up when patient more stable  Obesity - There is no height or weight on file to calculate BMI.   Code Status: FULL CODE Family Communication:  Status is: Inpatient  Remains inpatient appropriate because:Inpatient level of care appropriate due to severity of illness   Dispo: The patient is from: Home              Anticipated d/c is to: Home              Anticipated d/c date is: > 3 days              Patient currently is not medically stable to d/c.  Consultants:  none  Objective: Blood pressure 115/64, pulse 88, temperature 100.2 F (37.9 C), temperature source Oral, resp. rate (!) 30, SpO2 (!) 87 %.  Intake/Output Summary (Last 24 hours) at 11/04/2020 0833 Last data filed at 11/03/2020 2210 Gross per 24 hour  Intake 250 ml  Output -  Net 250 ml   There were  no vitals filed for this visit.  Examination: F/u exam completed  CBC: Recent Labs  Lab 11/03/20 2023  WBC 5.4  NEUTROABS 4.2  HGB 16.3  HCT 46.9  MCV 85.0  PLT 172   Basic Metabolic Panel: Recent Labs  Lab 11/03/20 2023  NA 133*  K 4.0  CL 98  CO2 23  GLUCOSE 99  BUN 7  CREATININE 0.86  CALCIUM 8.5*   GFR: CrCl cannot be calculated (Unknown ideal weight.).  Liver Function Tests: Recent Labs  Lab 11/03/20 2023  AST 94*  ALT 49*  ALKPHOS 44  BILITOT 1.2  PROT 6.1*  ALBUMIN 3.2*   No results for input(s): LIPASE, AMYLASE in the last 168 hours. No results for input(s): AMMONIA in the  last 168 hours.  Coagulation Profile: No results for input(s): INR, PROTIME in the last 168 hours.  Cardiac Enzymes: No results for input(s): CKTOTAL, CKMB, CKMBINDEX, TROPONINI in the last 168 hours.  HbA1C: No results found for: HGBA1C  CBG: No results for input(s): GLUCAP in the last 168 hours.  Recent Results (from the past 240 hour(s))  Resp Panel by RT-PCR (Flu A&B, Covid) Nasopharyngeal Swab     Status: Abnormal   Collection Time: 11/03/20  9:45 PM   Specimen: Nasopharyngeal Swab; Nasopharyngeal(NP) swabs in vial transport medium  Result Value Ref Range Status   SARS Coronavirus 2 by RT PCR POSITIVE (A) NEGATIVE Final    Comment: RESULT CALLED TO, READ BACK BY AND VERIFIED WITH: Heloise Purpura RN 11/03/20 0037 JDW (NOTE) SARS-CoV-2 target nucleic acids are DETECTED.  The SARS-CoV-2 RNA is generally detectable in upper respiratory specimens during the acute phase of infection. Positive results are indicative of the presence of the identified virus, but do not rule out bacterial infection or co-infection with other pathogens not detected by the test. Clinical correlation with patient history and other diagnostic information is necessary to determine patient infection status. The expected result is Negative.  Fact Sheet for Patients: BloggerCourse.com  Fact Sheet for Healthcare Providers: SeriousBroker.it  This test is not yet approved or cleared by the [place] FDA and  has been authorized for detection and/or diagnosis of SARS-CoV-2 by FDA under an Emergency Use Authorization (EUA).  This EUA will remain in effect (meaning this test can be u sed) for the duration of  the COVID-19 declaration under Section 564(b)(1) of the Act, 21 U.S.C. section 360bbb-3(b)(1), unless the authorization is terminated or revoked sooner.     Influenza A by PCR NEGATIVE NEGATIVE Final   Influenza B by PCR NEGATIVE NEGATIVE Final     Comment: (NOTE) The Xpert Xpress SARS-CoV-2/FLU/RSV plus assay is intended as an aid in the diagnosis of influenza from Nasopharyngeal swab specimens and should not be used as a sole basis for treatment. Nasal washings and aspirates are unacceptable for Xpert Xpress SARS-CoV-2/FLU/RSV testing.  Fact Sheet for Patients: BloggerCourse.com  Fact Sheet for Healthcare Providers: SeriousBroker.it  This test is not yet approved or cleared by the [place] FDA and has been authorized for detection and/or diagnosis of SARS-CoV-2 by FDA under an Emergency Use Authorization (EUA). This EUA will remain in effect (meaning this test can be used) for the duration of the COVID-19 declaration under Section 564(b)(1) of the Act, 21 U.S.C. section 360bbb-3(b)(1), unless the authorization is terminated or revoked.  Performed at [facility], [address], [place], [place] [address]      Scheduled Meds: . vitamin C  500 mg Oral Daily  .  dexamethasone (DECADRON) injection  6 mg Intravenous Q24H  . enoxaparin (LOVENOX) injection  40 mg Subcutaneous Q24H  . PARoxetine  5 mg Oral QPM  . zinc sulfate  220 mg Oral Daily   Continuous Infusions: . lactated ringers 125 mL/hr at 11/04/20 0257  . remdesivir 100 mg in NS 100 mL       LOS: 1 day   Lonia Blood, MD [facility] Office  [phone] Pager - Text Page per Amion  If 7PM-7AM, please contact night-coverage per Amion 11/04/2020, 8:33 AM

## 2020-11-05 DIAGNOSIS — R7989 Other specified abnormal findings of blood chemistry: Secondary | ICD-10-CM

## 2020-11-05 DIAGNOSIS — F411 Generalized anxiety disorder: Secondary | ICD-10-CM

## 2020-11-05 DIAGNOSIS — J9601 Acute respiratory failure with hypoxia: Secondary | ICD-10-CM

## 2020-11-05 LAB — COMPREHENSIVE METABOLIC PANEL
ALT: 46 U/L — ABNORMAL HIGH (ref 0–44)
AST: 74 U/L — ABNORMAL HIGH (ref 15–41)
Albumin: 2.6 g/dL — ABNORMAL LOW (ref 3.5–5.0)
Alkaline Phosphatase: 44 U/L (ref 38–126)
Anion gap: 14 (ref 5–15)
BUN: 9 mg/dL (ref 6–20)
CO2: 21 mmol/L — ABNORMAL LOW (ref 22–32)
Calcium: 8.2 mg/dL — ABNORMAL LOW (ref 8.9–10.3)
Chloride: 102 mmol/L (ref 98–111)
Creatinine, Ser: 0.78 mg/dL (ref 0.61–1.24)
GFR, Estimated: >60 mL/min (ref >60)
Glucose, Bld: 181 mg/dL — ABNORMAL HIGH (ref 70–99)
Potassium: 4.1 mmol/L (ref 3.5–5.1)
Sodium: 137 mmol/L (ref 135–145)
Total Bilirubin: 0.8 mg/dL (ref 0.3–1.2)
Total Protein: 5.6 g/dL — ABNORMAL LOW (ref 6.5–8.1)

## 2020-11-05 LAB — CBC WITH DIFFERENTIAL/PLATELET
Abs Immature Granulocytes: 0.07 10*3/uL (ref 0.00–0.07)
Basophils Absolute: 0 10*3/uL (ref 0.0–0.1)
Basophils Relative: 0 %
Eosinophils Absolute: 0 10*3/uL (ref 0.0–0.5)
Eosinophils Relative: 0 %
HCT: 46.7 % (ref 39.0–52.0)
Hemoglobin: 15.8 g/dL (ref 13.0–17.0)
Immature Granulocytes: 1 %
Lymphocytes Relative: 13 %
Lymphs Abs: 1.1 10*3/uL (ref 0.7–4.0)
MCH: 29.2 pg (ref 26.0–34.0)
MCHC: 33.8 g/dL (ref 30.0–36.0)
MCV: 86.3 fL (ref 80.0–100.0)
Monocytes Absolute: 0.4 10*3/uL (ref 0.1–1.0)
Monocytes Relative: 4 %
Neutro Abs: 7 10*3/uL (ref 1.7–7.7)
Neutrophils Relative %: 82 %
Platelets: 231 10*3/uL (ref 150–400)
RBC: 5.41 MIL/uL (ref 4.22–5.81)
RDW: 13.5 % (ref 11.5–15.5)
WBC: 8.5 10*3/uL (ref 4.0–10.5)
nRBC: 0 % (ref 0.0–0.2)

## 2020-11-05 LAB — C-REACTIVE PROTEIN: CRP: 4.8 mg/dL — ABNORMAL HIGH (ref <1.0)

## 2020-11-05 LAB — MAGNESIUM: Magnesium: 1.7 mg/dL (ref 1.7–2.4)

## 2020-11-05 LAB — D-DIMER, QUANTITATIVE: D-Dimer, Quant: 1.83 ug/mL-FEU — ABNORMAL HIGH (ref 0.00–0.50)

## 2020-11-05 NOTE — ED Notes (Signed)
Justin Archer, wife, [phone] would like an update when available

## 2020-11-05 NOTE — ED Notes (Signed)
Per report from previous RN Anise Salvo, Provider advised to maintain patient's O2 sats at 85% or greater.

## 2020-11-05 NOTE — ED Notes (Signed)
Pt wife called, Kamaree Wheatley, to get an update please call [phone]

## 2020-11-05 NOTE — ED Notes (Signed)
RN notified about vitals 

## 2020-11-05 NOTE — ED Notes (Signed)
Patient ambulated to the bathroom in his room for bowel movement. Placed on NRB @ 15 l/min and he was able to tolerate the activity with assist x 1. Patient very out of breath when back to bed, so placed on duel salter HFNC and NRB for 10 min and then NRB removed. Patient remains alert and oriented. See VS flow sheet

## 2020-11-05 NOTE — Progress Notes (Signed)
Justin Archer  VVO:[id number] DOB: [birth year] DOA: 11/03/2020 PCP: [facility]    Brief Narrative:  40 year old with a history of HTN, hypogonadism, and generalized anxiety disorder who was sent to [facility] ED from a local urgent care due to hypoxia.  He reports developing generalized weakness muscle aches and intermittent fevers 3 days before Christmas, leading him to present to an urgent care on 12/24 where he was diagnosed with Covid.  Since that time the patient has developed a severe cough and progressive shortness of breath.  Due to progression of his symptoms he return to the urgent care 1/1 for reevaluation when he was found to be hypoxic in the 80s and sent to the ED.  Significant Events:  12/22 onset of symptoms 12/24 Covid positive at urgent care 1/1 return to urgent care -referred to ED for hypoxia 1/2 admit via [facility]  Date of Positive COVID Test:  12/24  Vaccination Status: Not vaccinated  COVID-19 specific Treatment: Remdesivir 1/1 > Decadron 1/1 > Actemra 1/2  Antimicrobials:  None  DVT prophylaxis: Lovenox  Subjective: Requiring salter high flow nasal cannula at 15 L.  Saturations mid to upper 80s.  Remains tachypneic and tachycardic.  Assessment & Plan:  COVID Pneumonia -acute hypoxic respiratory failure Continue Remdesivir -change Decadron to Solu-Medrol -dosed with Actemra -explained to patient that he will likely be in the hospital for many days -encouraged him to control his breathing and anxiety to the best of his ability -relatively stable today without decline or significant improvement thus far  Recent Labs  Lab 11/03/20 2023 11/05/20 0609  DDIMER 1.03* 1.83*  FERRITIN 525*  --   CRP 12.0* 4.8*  ALT 49* 46*  PROCALCITON <0.10  --     Elevated D-dimer No PE on CTa chest  Hyponatremia Corrected with volume resuscitation  Mildly elevated transaminases Improving  HTN Blood pressure presently controlled  Generalized  anxiety disorder  2.5 cm right thyroid nodule Incidentally noted on CT chest -ultrasound follow-up when patient more stable -check a TSH  Obesity - There is no height or weight on file to calculate BMI.   Code Status: FULL CODE Family Communication:  Status is: Inpatient  Remains inpatient appropriate because:Inpatient level of care appropriate due to severity of illness   Dispo: The patient is from: Home              Anticipated d/c is to: Home              Anticipated d/c date is: > 3 days              Patient currently is not medically stable to d/c.  Consultants:  none  Objective: Blood pressure 110/73, pulse (!) 103, temperature 98.4 F (36.9 C), temperature source Oral, resp. rate (!) 23, SpO2 (!) 86 %.  Intake/Output Summary (Last 24 hours) at 11/05/2020 0952 Last data filed at 11/04/2020 2310 Gross per 24 hour  Intake 2156.15 ml  Output 2700 ml  Net -543.85 ml   There were no vitals filed for this visit.  Examination: General: Not in extremis with high-level oxygen support Lungs: Diffuse fine crackles with no wheezing Cardiovascular: Tachycardic but regular without murmur or rub Abdomen: Nontender, nondistended, soft, bowel sounds positive, no rebound, no ascites, no appreciable mass Extremities: No significant cyanosis, clubbing, or edema bilateral lower extremities   CBC: Recent Labs  Lab 11/03/20 2023 11/05/20 0609  WBC 5.4 8.5  NEUTROABS 4.2 7.0  HGB 16.3 15.8  HCT 46.9 46.7  MCV 85.0 86.3  PLT 172 231   Basic Metabolic Panel: Recent Labs  Lab 11/03/20 2023 11/05/20 0609  NA 133* 137  K 4.0 4.1  CL 98 102  CO2 23 21*  GLUCOSE 99 181*  BUN 7 9  CREATININE 0.86 0.78  CALCIUM 8.5* 8.2*  MG  --  1.7   GFR: CrCl cannot be calculated (Unknown ideal weight.).  Liver Function Tests: Recent Labs  Lab 11/03/20 2023 11/05/20 0609  AST 94* 74*  ALT 49* 46*  ALKPHOS 44 44  BILITOT 1.2 0.8  PROT 6.1* 5.6*  ALBUMIN 3.2* 2.6*   No results  for input(s): LIPASE, AMYLASE in the last 168 hours. No results for input(s): AMMONIA in the last 168 hours.  Coagulation Profile: No results for input(s): INR, PROTIME in the last 168 hours.  Cardiac Enzymes: No results for input(s): CKTOTAL, CKMB, CKMBINDEX, TROPONINI in the last 168 hours.  HbA1C: No results found for: HGBA1C  CBG: No results for input(s): GLUCAP in the last 168 hours.  Recent Results (from the past 240 hour(s))  Blood Culture (routine x 2)     Status: None (Preliminary result)   Collection Time: 11/03/20  8:22 PM   Specimen: BLOOD  Result Value Ref Range Status   Specimen Description BLOOD RIGHT ANTECUBITAL  Final   Special Requests   Final    BOTTLES DRAWN AEROBIC AND ANAEROBIC Blood Culture adequate volume   Culture   Final    NO GROWTH < 12 HOURS Performed at [facility], [address] [place], [place] [address]    Report Status PENDING  Incomplete  Blood Culture (routine x 2)     Status: None (Preliminary result)   Collection Time: 11/03/20  8:23 PM   Specimen: BLOOD  Result Value Ref Range Status   Specimen Description BLOOD LEFT ANTECUBITAL  Final   Special Requests   Final    BOTTLES DRAWN AEROBIC AND ANAEROBIC Blood Culture adequate volume   Culture   Final    NO GROWTH < 12 HOURS Performed at [facility], [address], [place], [place] [address]    Report Status PENDING  Incomplete  Resp Panel by RT-PCR (Flu A&B, Covid) Nasopharyngeal Swab     Status: Abnormal   Collection Time: 11/03/20  9:45 PM   Specimen: Nasopharyngeal Swab; Nasopharyngeal(NP) swabs in vial transport medium  Result Value Ref Range Status   SARS Coronavirus 2 by RT PCR POSITIVE (A) NEGATIVE Final    Comment: RESULT CALLED TO, READ BACK BY AND VERIFIED WITH: Heloise Purpura RN 11/03/20 0037 JDW (NOTE) SARS-CoV-2 target nucleic acids are DETECTED.  The SARS-CoV-2 RNA is generally detectable in upper respiratory specimens during the acute phase of  infection. Positive results are indicative of the presence of the identified virus, but do not rule out bacterial infection or co-infection with other pathogens not detected by the test. Clinical correlation with patient history and other diagnostic information is necessary to determine patient infection status. The expected result is Negative.  Fact Sheet for Patients: BloggerCourse.com  Fact Sheet for Healthcare Providers: SeriousBroker.it  This test is not yet approved or cleared by the [place] FDA and  has been authorized for detection and/or diagnosis of SARS-CoV-2 by FDA under an Emergency Use Authorization (EUA).  This EUA will remain in effect (meaning this test can be u sed) for the duration of  the COVID-19 declaration under Section 564(b)(1) of the Act, 21  U.S.C. section 360bbb-3(b)(1), unless the authorization is terminated or revoked sooner.     Influenza A by PCR NEGATIVE NEGATIVE Final   Influenza B by PCR NEGATIVE NEGATIVE Final    Comment: (NOTE) The Xpert Xpress SARS-CoV-2/FLU/RSV plus assay is intended as an aid in the diagnosis of influenza from Nasopharyngeal swab specimens and should not be used as a sole basis for treatment. Nasal washings and aspirates are unacceptable for Xpert Xpress SARS-CoV-2/FLU/RSV testing.  Fact Sheet for Patients: EntrepreneurPulse.com.au  Fact Sheet for Healthcare Providers: IncredibleEmployment.be  This test is not yet approved or cleared by the [place] FDA and has been authorized for detection and/or diagnosis of SARS-CoV-2 by FDA under an Emergency Use Authorization (EUA). This EUA will remain in effect (meaning this test can be used) for the duration of the COVID-19 declaration under Section 564(b)(1) of the Act, 21 U.S.C. section 360bbb-3(b)(1), unless the authorization is terminated or revoked.  Performed at [facility], [place] [address], [place], [place] [address]      Scheduled Meds: . vitamin C  500 mg Oral Daily  . enoxaparin (LOVENOX) injection  40 mg Subcutaneous Q24H  . PARoxetine  10 mg Oral Daily  . zinc sulfate  220 mg Oral Daily   Continuous Infusions: . methylPREDNISolone (SOLU-MEDROL) injection Stopped (11/04/20 2310)  . remdesivir 100 mg in NS 100 mL Stopped (11/04/20 1422)     LOS: 2 days   Cherene Altes, MD [facility] Office  [phone] Pager - Text Page per Amion  If 7PM-7AM, please contact night-coverage per Amion 11/05/2020, 9:52 AM

## 2020-11-05 NOTE — ED Notes (Signed)
Lunch Tray Ordered @ 1045 

## 2020-11-05 NOTE — ED Notes (Signed)
This RN called to give report to 2W awaiting call back.

## 2020-11-05 NOTE — ED Notes (Signed)
Patient care assumed at this time. Patient resting on his right side and on monitors at this time. Stable with salter Ferguson @ 15 l//min and sats @ 85-87%. Patient to be maintained greater than 85%. Patient with Covid isolation precautions.

## 2020-11-05 NOTE — ED Notes (Signed)
Pt at bedside provided water and journal materials upon request. Pt currently denies any needs or wants at this time.

## 2020-11-06 ENCOUNTER — Inpatient Hospital Stay (HOSPITAL_COMMUNITY): Payer: BC Managed Care – PPO

## 2020-11-06 LAB — CBC WITH DIFFERENTIAL/PLATELET
Abs Immature Granulocytes: 0.11 10*3/uL — ABNORMAL HIGH (ref 0.00–0.07)
Basophils Absolute: 0 10*3/uL (ref 0.0–0.1)
Basophils Relative: 0 %
Eosinophils Absolute: 0 10*3/uL (ref 0.0–0.5)
Eosinophils Relative: 0 %
HCT: 49 % (ref 39.0–52.0)
Hemoglobin: 16.1 g/dL (ref 13.0–17.0)
Immature Granulocytes: 1 %
Lymphocytes Relative: 10 %
Lymphs Abs: 1.2 10*3/uL (ref 0.7–4.0)
MCH: 28.2 pg (ref 26.0–34.0)
MCHC: 32.9 g/dL (ref 30.0–36.0)
MCV: 85.8 fL (ref 80.0–100.0)
Monocytes Absolute: 0.4 10*3/uL (ref 0.1–1.0)
Monocytes Relative: 3 %
Neutro Abs: 10.5 10*3/uL — ABNORMAL HIGH (ref 1.7–7.7)
Neutrophils Relative %: 86 %
Platelets: 264 10*3/uL (ref 150–400)
RBC: 5.71 MIL/uL (ref 4.22–5.81)
RDW: 13.3 % (ref 11.5–15.5)
WBC: 12.3 10*3/uL — ABNORMAL HIGH (ref 4.0–10.5)
nRBC: 0 % (ref 0.0–0.2)

## 2020-11-06 LAB — COMPREHENSIVE METABOLIC PANEL
ALT: 71 U/L — ABNORMAL HIGH (ref 0–44)
AST: 77 U/L — ABNORMAL HIGH (ref 15–41)
Albumin: 2.9 g/dL — ABNORMAL LOW (ref 3.5–5.0)
Alkaline Phosphatase: 49 U/L (ref 38–126)
Anion gap: 13 (ref 5–15)
BUN: 12 mg/dL (ref 6–20)
CO2: 23 mmol/L (ref 22–32)
Calcium: 8.4 mg/dL — ABNORMAL LOW (ref 8.9–10.3)
Chloride: 102 mmol/L (ref 98–111)
Creatinine, Ser: 0.82 mg/dL (ref 0.61–1.24)
GFR, Estimated: >60 mL/min (ref >60)
Glucose, Bld: 146 mg/dL — ABNORMAL HIGH (ref 70–99)
Potassium: 4 mmol/L (ref 3.5–5.1)
Sodium: 138 mmol/L (ref 135–145)
Total Bilirubin: 1.1 mg/dL (ref 0.3–1.2)
Total Protein: 5.6 g/dL — ABNORMAL LOW (ref 6.5–8.1)

## 2020-11-06 LAB — C-REACTIVE PROTEIN: CRP: 1.9 mg/dL — ABNORMAL HIGH (ref <1.0)

## 2020-11-06 LAB — T4, FREE: Free T4: 1.01 ng/dL (ref 0.61–1.12)

## 2020-11-06 LAB — TSH: TSH: 0.604 u[IU]/mL (ref 0.350–4.500)

## 2020-11-06 LAB — MAGNESIUM: Magnesium: 2 mg/dL (ref 1.7–2.4)

## 2020-11-06 LAB — D-DIMER, QUANTITATIVE: D-Dimer, Quant: 3.45 ug/mL-FEU — ABNORMAL HIGH (ref 0.00–0.50)

## 2020-11-06 NOTE — Plan of Care (Signed)
  Problem: Education: Goal: Knowledge of risk factors and measures for prevention of condition will improve Outcome: Progressing   Problem: Coping: Goal: Psychosocial and spiritual needs will be supported Outcome: Progressing   Problem: Respiratory: Goal: Will maintain a patent airway Outcome: Progressing Goal: Complications related to the disease process, condition or treatment will be avoided or minimized Outcome: Progressing   

## 2020-11-06 NOTE — Progress Notes (Signed)
Pt asked gluten allergie  Be removed from chart because he is not allergic to gluten his wife is and so he just goes along with her diet.

## 2020-11-06 NOTE — Progress Notes (Signed)
Justin Archer  WNU:[id number] DOB: [birth year] DOA: 11/03/2020 PCP: [facility]    Brief Narrative:  40 year old with a history of HTN, hypogonadism, and generalized anxiety disorder who was sent to [facility] ED from a local urgent care due to hypoxia.  He reports developing generalized weakness muscle aches and intermittent fevers 3 days before Christmas, leading him to present to an urgent care on 12/24 where he was diagnosed with Covid.  Since that time the patient has developed a severe cough and progressive shortness of breath.  Due to progression of his symptoms he return to the urgent care 1/1 for reevaluation when he was found to be hypoxic in the 80s and sent to the ED.  Significant Events:  12/22 onset of symptoms 12/24 Covid positive at urgent care 1/1 return to urgent care -referred to ED for hypoxia 1/2 admit via [facility] ED  Date of Positive COVID Test:  12/24  Vaccination Status: Not vaccinated  COVID-19 specific Treatment: Remdesivir 1/1 > Decadron 1/1 > Actemra 1/2  Antimicrobials:  None  DVT prophylaxis: Lovenox  Subjective: Tachycardia has improved significantly.  Patient is sitting up in a bedside chair using his incentive spirometer when I enter the room.  He is much more calm and his respirations are much less labored today.  He appears to be improving nicely.  He states he feels significantly less short of breath.  He denies chest pain nausea vomiting or abdominal pain.  Assessment & Plan:  COVID Pneumonia -acute hypoxic respiratory failure Continue Remdesivir -changed Decadron to Solu-Medrol -dosed with Actemra -encouraged ongoing use of incentive spirometer -appears he may be turning the corner today   Recent Labs  Lab 11/03/20 2023 11/05/20 0609 11/06/20 0436  DDIMER 1.03* 1.83* 3.45*  FERRITIN 525*  --   --   CRP 12.0* 4.8* 1.9*  ALT 49* 46* 71*  PROCALCITON <0.10  --   --     Elevated D-dimer No PE on CTa chest -continue  prophylactic dose Lovenox only for now but if D-dimer continues to climb will evaluate for possible DVT  Hyponatremia Corrected with volume resuscitation  Mildly elevated transaminases Continue to follow trend -likely due to Covid itself or Remdesivir dosing or both  HTN Blood pressure presently controlled  Generalized anxiety disorder Much more calm today  2.5 cm right thyroid nodule Incidentally noted on CT chest -ultrasound follow-up when patient more stable -TSH and free T4 entirely normal   Obesity - There is no height or weight on file to calculate BMI.   Code Status: FULL CODE Family Communication:  Status is: Inpatient  Remains inpatient appropriate because:Inpatient level of care appropriate due to severity of illness   Dispo: The patient is from: Home              Anticipated d/c is to: Home              Anticipated d/c date is: > 3 days              Patient currently is not medically stable to d/c.  Consultants:  none  Objective: Blood pressure (!) 144/97, pulse (!) 101, temperature 98.4 F (36.9 C), temperature source Oral, resp. rate 17, SpO2 97 %.  Intake/Output Summary (Last 24 hours) at 11/06/2020 1118 Last data filed at 11/06/2020 0500 Gross per 24 hour  Intake 100 ml  Output 1550 ml  Net -1450 ml   There were no vitals filed for this visit.  Examination: General: Not  in extremis -calm Lungs: Diffuse fine crackles without change -no wheezing Cardiovascular: RRR without murmur Abdomen: Overweight, soft, bowel sounds positive, no rebound Extremities: Trace bilateral lower extremity edema   CBC: Recent Labs  Lab 11/03/20 2023 11/05/20 0609 11/06/20 0436  WBC 5.4 8.5 12.3*  NEUTROABS 4.2 7.0 10.5*  HGB 16.3 15.8 16.1  HCT 46.9 46.7 49.0  MCV 85.0 86.3 85.8  PLT 172 231 264   Basic Metabolic Panel: Recent Labs  Lab 11/03/20 2023 11/05/20 0609 11/06/20 0436  NA 133* 137 138  K 4.0 4.1 4.0  CL 98 102 102  CO2 23 21* 23  GLUCOSE 99  181* 146*  BUN 7 9 12   CREATININE 0.86 0.78 0.82  CALCIUM 8.5* 8.2* 8.4*  MG  --  1.7 2.0   GFR: CrCl cannot be calculated (Unknown ideal weight.).  Liver Function Tests: Recent Labs  Lab 11/03/20 2023 11/05/20 0609 11/06/20 0436  AST 94* 74* 77*  ALT 49* 46* 71*  ALKPHOS 44 44 49  BILITOT 1.2 0.8 1.1  PROT 6.1* 5.6* 5.6*  ALBUMIN 3.2* 2.6* 2.9*     Recent Results (from the past 240 hour(s))  Blood Culture (routine x 2)     Status: None (Preliminary result)   Collection Time: 11/03/20  8:22 PM   Specimen: BLOOD  Result Value Ref Range Status   Specimen Description BLOOD RIGHT ANTECUBITAL  Final   Special Requests   Final    BOTTLES DRAWN AEROBIC AND ANAEROBIC Blood Culture adequate volume   Culture   Final    NO GROWTH 3 DAYS Performed at [facility], [address] [place], [place]    Report Status PENDING  Incomplete  Blood Culture (routine x 2)     Status: None (Preliminary result)   Collection Time: 11/03/20  8:23 PM   Specimen: BLOOD  Result Value Ref Range Status   Specimen Description BLOOD LEFT ANTECUBITAL  Final   Special Requests   Final    BOTTLES DRAWN AEROBIC AND ANAEROBIC Blood Culture adequate volume   Culture   Final    NO GROWTH 3 DAYS Performed at [facility], [address] [place], [place]    Report Status PENDING  Incomplete  Resp Panel by RT-PCR (Flu A&B, Covid) Nasopharyngeal Swab     Status: Abnormal   Collection Time: 11/03/20  9:45 PM   Specimen: Nasopharyngeal Swab; Nasopharyngeal(NP) swabs in vial transport medium  Result Value Ref Range Status   SARS Coronavirus 2 by RT PCR POSITIVE (A) NEGATIVE Final    Comment: RESULT CALLED TO, READ BACK BY AND VERIFIED WITH: 01/01/21 RN 11/03/20 0037 JDW (NOTE) SARS-CoV-2 target nucleic acids are DETECTED.  The SARS-CoV-2 RNA is generally detectable in upper respiratory specimens during the acute phase of infection. Positive results are indicative of  the presence of the identified virus, but do not rule out bacterial infection or co-infection with other pathogens not detected by the test. Clinical correlation with patient history and other diagnostic information is necessary to determine patient infection status. The expected result is Negative.  Fact Sheet for Patients: 01/01/21  Fact Sheet for Healthcare Providers: BloggerCourse.com  This test is not yet approved or cleared by the [place] FDA and  has been authorized for detection and/or diagnosis of SARS-CoV-2 by FDA under an Emergency Use Authorization (EUA).  This EUA will remain in effect (meaning this test can be u sed) for the duration of  the  COVID-19 declaration under Section 564(b)(1) of the Act, 21 U.S.C. section 360bbb-3(b)(1), unless the authorization is terminated or revoked sooner.     Influenza A by PCR NEGATIVE NEGATIVE Final   Influenza B by PCR NEGATIVE NEGATIVE Final    Comment: (NOTE) The Xpert Xpress SARS-CoV-2/FLU/RSV plus assay is intended as an aid in the diagnosis of influenza from Nasopharyngeal swab specimens and should not be used as a sole basis for treatment. Nasal washings and aspirates are unacceptable for Xpert Xpress SARS-CoV-2/FLU/RSV testing.  Fact Sheet for Patients: BloggerCourse.com  Fact Sheet for Healthcare Providers: SeriousBroker.it  This test is not yet approved or cleared by the [place] FDA and has been authorized for detection and/or diagnosis of SARS-CoV-2 by FDA under an Emergency Use Authorization (EUA). This EUA will remain in effect (meaning this test can be used) for the duration of the COVID-19 declaration under Section 564(b)(1) of the Act, 21 U.S.C. section 360bbb-3(b)(1), unless the authorization is terminated or revoked.  Performed at [facility], [address] [place],  [place] [address]      Scheduled Meds: . vitamin C  500 mg Oral Daily  . enoxaparin (LOVENOX) injection  40 mg Subcutaneous Q24H  . PARoxetine  10 mg Oral Daily  . zinc sulfate  220 mg Oral Daily   Continuous Infusions: . methylPREDNISolone (SOLU-MEDROL) injection 160 mg (11/06/20 0947)  . remdesivir 100 mg in NS 100 mL 100 mg (11/06/20 0949)     LOS: 3 days   Lonia Blood, MD [facility] Office  [phone] Pager - Text Page per Amion  If 7PM-7AM, please contact night-coverage per Amion 11/06/2020, 11:18 AM

## 2020-11-07 DIAGNOSIS — J1282 Pneumonia due to coronavirus disease 2019: Secondary | ICD-10-CM

## 2020-11-07 LAB — COMPREHENSIVE METABOLIC PANEL
ALT: 65 U/L — ABNORMAL HIGH (ref 0–44)
AST: 49 U/L — ABNORMAL HIGH (ref 15–41)
Albumin: 2.7 g/dL — ABNORMAL LOW (ref 3.5–5.0)
Alkaline Phosphatase: 48 U/L (ref 38–126)
Anion gap: 10 (ref 5–15)
BUN: 15 mg/dL (ref 6–20)
CO2: 25 mmol/L (ref 22–32)
Calcium: 8.2 mg/dL — ABNORMAL LOW (ref 8.9–10.3)
Chloride: 102 mmol/L (ref 98–111)
Creatinine, Ser: 0.89 mg/dL (ref 0.61–1.24)
GFR, Estimated: >60 mL/min (ref >60)
Glucose, Bld: 179 mg/dL — ABNORMAL HIGH (ref 70–99)
Potassium: 4.1 mmol/L (ref 3.5–5.1)
Sodium: 137 mmol/L (ref 135–145)
Total Bilirubin: 1 mg/dL (ref 0.3–1.2)
Total Protein: 5.3 g/dL — ABNORMAL LOW (ref 6.5–8.1)

## 2020-11-07 LAB — CBC WITH DIFFERENTIAL/PLATELET
Abs Immature Granulocytes: 0.15 10*3/uL — ABNORMAL HIGH (ref 0.00–0.07)
Basophils Absolute: 0 10*3/uL (ref 0.0–0.1)
Basophils Relative: 0 %
Eosinophils Absolute: 0 10*3/uL (ref 0.0–0.5)
Eosinophils Relative: 0 %
HCT: 45.6 % (ref 39.0–52.0)
Hemoglobin: 15.3 g/dL (ref 13.0–17.0)
Immature Granulocytes: 1 %
Lymphocytes Relative: 8 %
Lymphs Abs: 0.9 10*3/uL (ref 0.7–4.0)
MCH: 28.8 pg (ref 26.0–34.0)
MCHC: 33.6 g/dL (ref 30.0–36.0)
MCV: 85.9 fL (ref 80.0–100.0)
Monocytes Absolute: 0.4 10*3/uL (ref 0.1–1.0)
Monocytes Relative: 3 %
Neutro Abs: 10.2 10*3/uL — ABNORMAL HIGH (ref 1.7–7.7)
Neutrophils Relative %: 88 %
Platelets: 235 10*3/uL (ref 150–400)
RBC: 5.31 MIL/uL (ref 4.22–5.81)
RDW: 13.2 % (ref 11.5–15.5)
WBC: 11.6 10*3/uL — ABNORMAL HIGH (ref 4.0–10.5)
nRBC: 0 % (ref 0.0–0.2)

## 2020-11-07 LAB — D-DIMER, QUANTITATIVE: D-Dimer, Quant: 5.05 ug/mL-FEU — ABNORMAL HIGH (ref 0.00–0.50)

## 2020-11-07 LAB — MAGNESIUM: Magnesium: 2 mg/dL (ref 1.7–2.4)

## 2020-11-07 LAB — C-REACTIVE PROTEIN: CRP: 0.9 mg/dL (ref <1.0)

## 2020-11-07 MED ORDER — FAMOTIDINE 20 MG PO TABS
20.0000 mg | ORAL_TABLET | Freq: Every day | ORAL | Status: DC
  Administered 2020-11-07 – 2020-11-13 (×7): 20 mg via ORAL
  Filled 2020-11-07 (×8): qty 1

## 2020-11-07 NOTE — Plan of Care (Signed)
  Problem: Education: Goal: Knowledge of risk factors and measures for prevention of condition will improve Outcome: Progressing   Problem: Coping: Goal: Psychosocial and spiritual needs will be supported Outcome: Progressing   Problem: Respiratory: Goal: Will maintain a patent airway Outcome: Progressing Goal: Complications related to the disease process, condition or treatment will be avoided or minimized Outcome: Progressing   

## 2020-11-07 NOTE — Progress Notes (Signed)
PROGRESS NOTE  Justin Archer WLN:[id number] DOB: [birth year] DOA: 11/03/2020  PCP: [facility]  Brief History/Interval Summary: 40 year old with a history of HTN, hypogonadism, and generalized anxiety disorder who was sent to [facility] ED from a local urgent care due to hypoxia.  He reports developing generalized weakness muscle aches and intermittent fevers 3 days before Christmas, leading him to present to an urgent care on 12/24 where he was diagnosed with Covid.  Since that time the patient has developed a severe cough and progressive shortness of breath.  Due to progression of his symptoms he return to the urgent care 1/1 for reevaluation when he was found to be hypoxic in the 80s and sent to the ED.  Reason for Visit: Pneumonia due to COVID-19.  Acute respiratory failure with hypoxia  Consultants: None  Procedures: None  Antibiotics: Anti-infectives (From admission, onward)   Start     Dose/Rate Route Frequency Ordered Stop   11/04/20 1000  remdesivir 100 mg in sodium chloride 0.9 % 100 mL IVPB       "Followed by" Linked Group Details   100 mg 200 mL/hr over 30 Minutes Intravenous Daily 11/03/20 1829 11/07/20 0935   11/03/20 1830  remdesivir 200 mg in sodium chloride 0.9% 250 mL IVPB       "Followed by" Linked Group Details   200 mg 580 mL/hr over 30 Minutes Intravenous Once 11/03/20 1829 11/03/20 2210      Subjective/Interval History: Patient mentions that he is starting to feel better.  Still gets very short of breath with movement.  Denies any nausea vomiting.  Appetite is improving.    Assessment/Plan:  Acute Hypoxic Resp. Failure/Pneumonia due to COVID-19   Recent Labs  Lab 11/03/20 2023 11/05/20 0609 11/06/20 0436 11/07/20 0421  DDIMER 1.03* 1.83* 3.45* 5.05*  FERRITIN 525*  --   --   --   CRP 12.0* 4.8* 1.9* 0.9  ALT 49* 46* 71* 65*  PROCALCITON <0.10  --   --   --      Objective findings: Fever: Afebrile Oxygen requirements: Remains on  high flow nasal cannula 12 to 15 L/min.  Saturating in the early 90s  COVID 19 Therapeutics: Antibacterials: None Remdesivir: Completed course of Remdesivir Steroids: Remains on Solu-Medrol Diuretics: None yet Inhaled Steroids: None yet Actemra: Given Actemra PUD Prophylaxis: Initiate Pepcid DVT Prophylaxis:  Lovenox  From a respiratory standpoint patient remains tenuous.  Still requiring a lot of oxygen.  He has completed course of Remdesivir.  He remains on steroids and was also given Actemra.  Inflammatory markers have improved.  D-dimer noted to be climbing.  Will do lower extremity Doppler studies.  CT angiogram was done on 1/2 and did not show any PE.  Continue to trend inflammatory markers.  Continue prone position as much as possible.  Incentive spirometry and mobilization.  The treatment plan and use of medications and known side effects were discussed with patient/family. Some of the medications used are based on case reports/anecdotal data.  All other medications being used in the management of COVID-19 based on limited study data.  Complete risks and long-term side effects are unknown, however in the best clinical judgment they seem to be of some benefit.  Patient/family wanted to proceed with treatment options provided.  Hyponatremia Corrected with fluids.  Mildly elevated transaminases Secondary to COVID-19.  Continue to trend.  Essential hypertension Blood pressure reasonably well controlled.  Currently not on any antihypertensives.  Generalized anxiety disorder Stable.  Continue to monitor.  Right thyroid nodule Incidentally noted on CT chest.  Will need outpatient work-up for same.  TSH and free T4 were normal.  Obesity Estimated body mass index is 42.8 kg/m as calculated from the following:   Height as of 07/24/15: 6' 0.5" (1.842 m).   Weight as of 07/24/15: 145.2 kg.   DVT Prophylaxis: Lovenox Code Status: Full code Family Communication: Discussed with the  patient.  Call as well Disposition Plan: Hopefully return home when improved  Status is: Inpatient  Remains inpatient appropriate because:IV treatments appropriate due to intensity of illness or inability to take PO and Inpatient level of care appropriate due to severity of illness   Dispo:  Patient From: Home  Planned Disposition: Home  Expected discharge date: 11/09/2020  Medically stable for discharge: No       Medications:  Scheduled: . vitamin C  500 mg Oral Daily  . enoxaparin (LOVENOX) injection  40 mg Subcutaneous Q24H  . PARoxetine  10 mg Oral Daily  . zinc sulfate  220 mg Oral Daily   Continuous: . methylPREDNISolone (SOLU-MEDROL) injection 160 mg (11/07/20 0859)   ZOX:[id number], albuterol, guaiFENesin-codeine, hydrALAZINE, ondansetron **OR** ondansetron (ZOFRAN) IV, polyethylene glycol   Objective:  Vital Signs  Vitals:   11/06/20 2120 11/07/20 0624 11/07/20 0759 11/07/20 1314  BP: 135/83 122/68 128/63   Pulse: 99 79 73   Resp: 20 19 18    Temp: 97.7 F (36.5 C) 97.8 F (36.6 C) 98.6 F (37 C)   TempSrc: Oral Oral    SpO2: 94% 95% 98% 94%    Intake/Output Summary (Last 24 hours) at 11/07/2020 1334 Last data filed at 11/07/2020 0700 Gross per 24 hour  Intake 100 ml  Output 1600 ml  Net -1500 ml   There were no vitals filed for this visit.  General appearance: Awake alert.  In no distress Resp: Noted to be tachypneic.  No use of accessory muscles.  Crackles bilateral bases.  No wheezing or rhonchi. Cardio: S1-S2 is normal regular.  No S3-S4.  No rubs murmurs or bruit GI: Abdomen is soft.  Nontender nondistended.  Bowel sounds are present normal.  No masses organomegaly Extremities: No edema.  Full range of motion of lower extremities. Neurologic: Alert and oriented x3.  No focal neurological deficits.    Lab Results:  Data Reviewed: I have personally reviewed following labs and imaging studies  CBC: Recent Labs  Lab 11/03/20 2023  11/05/20 0609 11/06/20 0436 11/07/20 0421  WBC 5.4 8.5 12.3* 11.6*  NEUTROABS 4.2 7.0 10.5* 10.2*  HGB 16.3 15.8 16.1 15.3  HCT 46.9 46.7 49.0 45.6  MCV 85.0 86.3 85.8 85.9  PLT 172 231 264 235    Basic Metabolic Panel: Recent Labs  Lab 11/03/20 2023 11/05/20 0609 11/06/20 0436 11/07/20 0421  NA 133* 137 138 137  K 4.0 4.1 4.0 4.1  CL 98 102 102 102  CO2 23 21* 23 25  GLUCOSE 99 181* 146* 179*  BUN 7 9 12 15   CREATININE 0.86 0.78 0.82 0.89  CALCIUM 8.5* 8.2* 8.4* 8.2*  MG  --  1.7 2.0 2.0    GFR: CrCl cannot be calculated (Unknown ideal weight.).  Liver Function Tests: Recent Labs  Lab 11/03/20 2023 11/05/20 0609 11/06/20 0436 11/07/20 0421  AST 94* 74* 77* 49*  ALT 49* 46* 71* 65*  ALKPHOS 44 44 49 48  BILITOT 1.2 0.8 1.1 1.0  PROT 6.1* 5.6* 5.6* 5.3*  ALBUMIN 3.2* 2.6* 2.9* 2.7*  Thyroid Function Tests: Recent Labs    11/06/20 0436  TSH 0.604  FREET4 1.01      Recent Results (from the past 240 hour(s))  Blood Culture (routine x 2)     Status: None (Preliminary result)   Collection Time: 11/03/20  8:22 PM   Specimen: BLOOD  Result Value Ref Range Status   Specimen Description BLOOD RIGHT ANTECUBITAL  Final   Special Requests   Final    BOTTLES DRAWN AEROBIC AND ANAEROBIC Blood Culture adequate volume   Culture   Final    NO GROWTH 4 DAYS Performed at [facility], [address], [place], [place] [address]    Report Status PENDING  Incomplete  Blood Culture (routine x 2)     Status: None (Preliminary result)   Collection Time: 11/03/20  8:23 PM   Specimen: BLOOD  Result Value Ref Range Status   Specimen Description BLOOD LEFT ANTECUBITAL  Final   Special Requests   Final    BOTTLES DRAWN AEROBIC AND ANAEROBIC Blood Culture adequate volume   Culture   Final    NO GROWTH 4 DAYS Performed at [facility], [address], [place], [place] [address]    Report Status PENDING  Incomplete  Resp Panel by RT-PCR (Flu A&B, Covid)  Nasopharyngeal Swab     Status: Abnormal   Collection Time: 11/03/20  9:45 PM   Specimen: Nasopharyngeal Swab; Nasopharyngeal(NP) swabs in vial transport medium  Result Value Ref Range Status   SARS Coronavirus 2 by RT PCR POSITIVE (A) NEGATIVE Final    Comment: RESULT CALLED TO, READ BACK BY AND VERIFIED WITH: Heloise Purpura RN 11/03/20 0037 JDW (NOTE) SARS-CoV-2 target nucleic acids are DETECTED.  The SARS-CoV-2 RNA is generally detectable in upper respiratory specimens during the acute phase of infection. Positive results are indicative of the presence of the identified virus, but do not rule out bacterial infection or co-infection with other pathogens not detected by the test. Clinical correlation with patient history and other diagnostic information is necessary to determine patient infection status. The expected result is Negative.  Fact Sheet for Patients: BloggerCourse.com  Fact Sheet for Healthcare Providers: SeriousBroker.it  This test is not yet approved or cleared by the [place] FDA and  has been authorized for detection and/or diagnosis of SARS-CoV-2 by FDA under an Emergency Use Authorization (EUA).  This EUA will remain in effect (meaning this test can be u sed) for the duration of  the COVID-19 declaration under Section 564(b)(1) of the Act, 21 U.S.C. section 360bbb-3(b)(1), unless the authorization is terminated or revoked sooner.     Influenza A by PCR NEGATIVE NEGATIVE Final   Influenza B by PCR NEGATIVE NEGATIVE Final    Comment: (NOTE) The Xpert Xpress SARS-CoV-2/FLU/RSV plus assay is intended as an aid in the diagnosis of influenza from Nasopharyngeal swab specimens and should not be used as a sole basis for treatment. Nasal washings and aspirates are unacceptable for Xpert Xpress SARS-CoV-2/FLU/RSV testing.  Fact Sheet for Patients: BloggerCourse.com  Fact Sheet for  Healthcare Providers: SeriousBroker.it  This test is not yet approved or cleared by the [place] FDA and has been authorized for detection and/or diagnosis of SARS-CoV-2 by FDA under an Emergency Use Authorization (EUA). This EUA will remain in effect (meaning this test can be used) for the duration of the COVID-19 declaration under Section 564(b)(1) of the Act, 21 U.S.C. section 360bbb-3(b)(1), unless the authorization is terminated or revoked.  Performed at  [facility], [address] [place], [place] [address]       Radiology Studies: DG Chest Port 1 View  Result Date: 11/06/2020 CLINICAL DATA:  Pneumonia.  COVID-19. EXAM: PORTABLE CHEST 1 VIEW COMPARISON:  CT 11/04/2020.  Chest x-ray 11/03/2020. FINDINGS: Stable cardiomegaly. Diffuse bilateral pulmonary infiltrates/edema again noted. Similar findings noted on prior exams. No pleural effusion or pneumothorax. No acute bony abnormality. IMPRESSION: 1. Stable cardiomegaly. 2. Diffuse bilateral pulmonary infiltrates/edema again noted. Similar findings noted on prior exams. Electronically Signed   By: Marcello Moores  Register   On: 11/06/2020 06:51       LOS: 4 days   [facility] Pager on [email or web address]  11/07/2020, 1:34 PM

## 2020-11-08 ENCOUNTER — Inpatient Hospital Stay (HOSPITAL_COMMUNITY): Payer: BC Managed Care – PPO

## 2020-11-08 DIAGNOSIS — I10 Essential (primary) hypertension: Secondary | ICD-10-CM

## 2020-11-08 DIAGNOSIS — U071 COVID-19: Secondary | ICD-10-CM

## 2020-11-08 DIAGNOSIS — R7989 Other specified abnormal findings of blood chemistry: Secondary | ICD-10-CM

## 2020-11-08 LAB — CBC WITH DIFFERENTIAL/PLATELET
Abs Immature Granulocytes: 0.38 10*3/uL — ABNORMAL HIGH (ref 0.00–0.07)
Basophils Absolute: 0 10*3/uL (ref 0.0–0.1)
Basophils Relative: 0 %
Eosinophils Absolute: 0 10*3/uL (ref 0.0–0.5)
Eosinophils Relative: 0 %
HCT: 47.1 % (ref 39.0–52.0)
Hemoglobin: 15.8 g/dL (ref 13.0–17.0)
Immature Granulocytes: 3 %
Lymphocytes Relative: 6 %
Lymphs Abs: 0.7 10*3/uL (ref 0.7–4.0)
MCH: 28.8 pg (ref 26.0–34.0)
MCHC: 33.5 g/dL (ref 30.0–36.0)
MCV: 85.8 fL (ref 80.0–100.0)
Monocytes Absolute: 0.4 10*3/uL (ref 0.1–1.0)
Monocytes Relative: 3 %
Neutro Abs: 10.3 10*3/uL — ABNORMAL HIGH (ref 1.7–7.7)
Neutrophils Relative %: 88 %
Platelets: 236 10*3/uL (ref 150–400)
RBC: 5.49 MIL/uL (ref 4.22–5.81)
RDW: 13.2 % (ref 11.5–15.5)
WBC: 11.7 10*3/uL — ABNORMAL HIGH (ref 4.0–10.5)
nRBC: 0.2 % (ref 0.0–0.2)

## 2020-11-08 LAB — COMPREHENSIVE METABOLIC PANEL
ALT: 61 U/L — ABNORMAL HIGH (ref 0–44)
AST: 31 U/L (ref 15–41)
Albumin: 2.7 g/dL — ABNORMAL LOW (ref 3.5–5.0)
Alkaline Phosphatase: 50 U/L (ref 38–126)
Anion gap: 12 (ref 5–15)
BUN: 15 mg/dL (ref 6–20)
CO2: 22 mmol/L (ref 22–32)
Calcium: 8 mg/dL — ABNORMAL LOW (ref 8.9–10.3)
Chloride: 102 mmol/L (ref 98–111)
Creatinine, Ser: 0.86 mg/dL (ref 0.61–1.24)
GFR, Estimated: >60 mL/min (ref >60)
Glucose, Bld: 167 mg/dL — ABNORMAL HIGH (ref 70–99)
Potassium: 4 mmol/L (ref 3.5–5.1)
Sodium: 136 mmol/L (ref 135–145)
Total Bilirubin: 0.8 mg/dL (ref 0.3–1.2)
Total Protein: 5.1 g/dL — ABNORMAL LOW (ref 6.5–8.1)

## 2020-11-08 LAB — CULTURE, BLOOD (ROUTINE X 2)
Culture: NO GROWTH
Culture: NO GROWTH
Special Requests: ADEQUATE
Special Requests: ADEQUATE

## 2020-11-08 LAB — D-DIMER, QUANTITATIVE: D-Dimer, Quant: 6.45 ug/mL-FEU — ABNORMAL HIGH (ref 0.00–0.50)

## 2020-11-08 LAB — MAGNESIUM: Magnesium: 2.1 mg/dL (ref 1.7–2.4)

## 2020-11-08 LAB — C-REACTIVE PROTEIN: CRP: 0.6 mg/dL (ref <1.0)

## 2020-11-08 MED ORDER — METHYLPREDNISOLONE SODIUM SUCC 125 MG IJ SOLR
80.0000 mg | Freq: Two times a day (BID) | INTRAMUSCULAR | Status: DC
  Administered 2020-11-08 – 2020-11-12 (×8): 80 mg via INTRAVENOUS
  Filled 2020-11-08 (×8): qty 2

## 2020-11-08 MED ORDER — MOMETASONE FURO-FORMOTEROL FUM 100-5 MCG/ACT IN AERO
2.0000 | INHALATION_SPRAY | Freq: Two times a day (BID) | RESPIRATORY_TRACT | Status: DC
  Administered 2020-11-08 – 2020-11-13 (×10): 2 via RESPIRATORY_TRACT
  Filled 2020-11-08: qty 8.8

## 2020-11-08 NOTE — Progress Notes (Signed)
VASCULAR LAB    Bilateral lower extremity venous duplex has been performed.  See CV proc for preliminary results.   Cage Gupton, RVT 11/08/2020, 12:25 PM

## 2020-11-08 NOTE — Progress Notes (Signed)
PROGRESS NOTE  Justin Archer DDU:[id number] DOB: 1981/05/01 DOA: 11/03/2020  PCP: [facility]  Brief History/Interval Summary: 40 year old with a history of HTN, hypogonadism, and generalized anxiety disorder who was sent to [facility] ED from a local urgent care due to hypoxia.  He reports developing generalized weakness muscle aches and intermittent fevers 3 days before Christmas, leading him to present to an urgent care on 12/24 where he was diagnosed with Covid.  Since that time the patient has developed a severe cough and progressive shortness of breath.  Due to progression of his symptoms he return to the urgent care 1/1 for reevaluation when he was found to be hypoxic in the 80s and sent to the ED.  Reason for Visit: Pneumonia due to COVID-19.  Acute respiratory failure with hypoxia  Consultants: None  Procedures: None  Antibiotics: Anti-infectives (From admission, onward)   Start     Dose/Rate Route Frequency Ordered Stop   11/04/20 1000  remdesivir 100 mg in sodium chloride 0.9 % 100 mL IVPB       "Followed by" Linked Group Details   100 mg 200 mL/hr over 30 Minutes Intravenous Daily 11/03/20 1829 11/07/20 0935   11/03/20 1830  remdesivir 200 mg in sodium chloride 0.9% 250 mL IVPB       "Followed by" Linked Group Details   200 mg 580 mL/hr over 30 Minutes Intravenous Once 11/03/20 1829 11/03/20 2210      Subjective/Interval History: Patient noted to be less anxious today.  States that he is feeling better compared to yesterday.  Still gets short of breath with ambulation     Assessment/Plan:  Acute Hypoxic Resp. Failure/Pneumonia due to COVID-19   Recent Labs  Lab 11/03/20 2023 11/05/20 0609 11/06/20 0436 11/07/20 0421 11/08/20 0436  DDIMER 1.03* 1.83* 3.45* 5.05* 6.45*  FERRITIN 525*  --   --   --   --   CRP 12.0* 4.8* 1.9* 0.9 0.6  ALT 49* 46* 71* 65* 61*  PROCALCITON <0.10  --   --   --   --      Objective findings: Oxygen requirements:  Remains on high flow nasal cannula.  Now only on 9 L of oxygen compared to the 12-15 he was on yesterday.  Saturating in the mid 90s   COVID 19 Therapeutics: Antibacterials: None Remdesivir: Completed course of Remdesivir Steroids: Remains on Solu-Medrol Diuretics: None yet Inhaled Steroids: Initiate Dulera Actemra: Given Actemra PUD Prophylaxis: Pepcid DVT Prophylaxis:  Lovenox  From a respiratory standpoint patient seems to be improving though still tenuous.  Still requiring a lot of oxygen.  Patient has completed course of Remdesivir and was also given Actemra.  Remains on Solu-Medrol.  CT angiogram done on 1/2 did not show any PE.  Inflammatory markers have improved.  D-dimer continues to climb gradually.  Lower extremity Doppler studies did not show any acute DVT.  Incentive spirometry mobilization.  Prone positioning as much as tolerated.  The treatment plan and use of medications and known side effects were discussed with patient/family. Some of the medications used are based on case reports/anecdotal data.  All other medications being used in the management of COVID-19 based on limited study data.  Complete risks and long-term side effects are unknown, however in the best clinical judgment they seem to be of some benefit.  Patient/family wanted to proceed with treatment options provided.  Hyponatremia Corrected with fluids.  Mildly elevated transaminases Secondary to COVID-19.  Continue to trend.  Essential  hypertension Blood pressure reasonably well controlled.  Currently not on any antihypertensives.  Generalized anxiety disorder Stable.  Continue to monitor.  Right thyroid nodule Incidentally noted on CT chest.  Will need outpatient work-up for same.  TSH and free T4 were normal.  Obesity Estimated body mass index is 42.8 kg/m as calculated from the following:   Height as of 07/24/15: 6' 0.5" (1.842 m).   Weight as of 07/24/15: 145.2 kg.   DVT Prophylaxis: Lovenox Code  Status: Full code Family Communication: Discussed with patient.  Wife was called yesterday.  We will update her again today. Disposition Plan: Hopefully return home when improved  Status is: Inpatient  Remains inpatient appropriate because:IV treatments appropriate due to intensity of illness or inability to take PO and Inpatient level of care appropriate due to severity of illness   Dispo:  Patient From: Home  Planned Disposition: Home  Expected discharge date: 11/09/2020  Medically stable for discharge: No       Medications:  Scheduled: . vitamin C  500 mg Oral Daily  . enoxaparin (LOVENOX) injection  40 mg Subcutaneous Q24H  . famotidine  20 mg Oral Daily  . PARoxetine  10 mg Oral Daily  . zinc sulfate  220 mg Oral Daily   Continuous: . methylPREDNISolone (SOLU-MEDROL) injection 160 mg (11/08/20 1051)   JFH:LKTGYBWLSLHTD, albuterol, guaiFENesin-codeine, hydrALAZINE, ondansetron **OR** ondansetron (ZOFRAN) IV, polyethylene glycol   Objective:  Vital Signs  Vitals:   11/07/20 1314 11/07/20 1440 11/07/20 2101 11/08/20 0555  BP:   138/75 126/76  Pulse:   84 71  Resp:   19 19  Temp:   98.4 F (36.9 C) 98 F (36.7 C)  TempSrc:   Oral Oral  SpO2: 94% 94% 93% 90%    Intake/Output Summary (Last 24 hours) at 11/08/2020 1243 Last data filed at 11/07/2020 1500 Gross per 24 hour  Intake 50 ml  Output --  Net 50 ml   There were no vitals filed for this visit.   General appearance: Awake alert.  In no distress Resp: Noted to be tachypneic but better than yesterday.  No use of accessory muscles.  Crackles bilateral bases.  No wheezing or rhonchi  Cardio: S1-S2 is normal regular.  No S3-S4.  No rubs murmurs or bruit GI: Abdomen is soft.  Nontender nondistended.  Bowel sounds are present normal.  No masses organomegaly Extremities: No edema.  Full range of motion of lower extremities. Neurologic: Alert and oriented x3.  No focal neurological deficits.     Lab  Results:  Data Reviewed: I have personally reviewed following labs and imaging studies  CBC: Recent Labs  Lab 11/03/20 2023 11/05/20 0609 11/06/20 0436 11/07/20 0421 11/08/20 0436  WBC 5.4 8.5 12.3* 11.6* 11.7*  NEUTROABS 4.2 7.0 10.5* 10.2* 10.3*  HGB 16.3 15.8 16.1 15.3 15.8  HCT 46.9 46.7 49.0 45.6 47.1  MCV 85.0 86.3 85.8 85.9 85.8  PLT 172 231 264 235 236    Basic Metabolic Panel: Recent Labs  Lab 11/03/20 2023 11/05/20 0609 11/06/20 0436 11/07/20 0421 11/08/20 0436  NA 133* 137 138 137 136  K 4.0 4.1 4.0 4.1 4.0  CL 98 102 102 102 102  CO2 23 21* 23 25 22   GLUCOSE 99 181* 146* 179* 167*  BUN 7 9 12 15 15   CREATININE 0.86 0.78 0.82 0.89 0.86  CALCIUM 8.5* 8.2* 8.4* 8.2* 8.0*  MG  --  1.7 2.0 2.0 2.1    GFR: CrCl cannot be calculated (Unknown  ideal weight.).  Liver Function Tests: Recent Labs  Lab 11/03/20 2023 11/05/20 0609 11/06/20 0436 11/07/20 0421 11/08/20 0436  AST 94* 74* 77* 49* 31  ALT 49* 46* 71* 65* 61*  ALKPHOS 44 44 49 48 50  BILITOT 1.2 0.8 1.1 1.0 0.8  PROT 6.1* 5.6* 5.6* 5.3* 5.1*  ALBUMIN 3.2* 2.6* 2.9* 2.7* 2.7*    Thyroid Function Tests: Recent Labs    11/06/20 0436  TSH 0.604  FREET4 1.01      Recent Results (from the past 240 hour(s))  Blood Culture (routine x 2)     Status: None   Collection Time: 11/03/20  8:22 PM   Specimen: BLOOD  Result Value Ref Range Status   Specimen Description BLOOD RIGHT ANTECUBITAL  Final   Special Requests   Final    BOTTLES DRAWN AEROBIC AND ANAEROBIC Blood Culture adequate volume   Culture   Final    NO GROWTH 5 DAYS Performed at [facility], [address] [place], [place] [address]    Report Status 11/08/2020 FINAL  Final  Blood Culture (routine x 2)     Status: None   Collection Time: 11/03/20  8:23 PM   Specimen: BLOOD  Result Value Ref Range Status   Specimen Description BLOOD LEFT ANTECUBITAL  Final   Special Requests   Final    BOTTLES DRAWN AEROBIC AND  ANAEROBIC Blood Culture adequate volume   Culture   Final    NO GROWTH 5 DAYS Performed at [facility], [address], [place], [place] [address]    Report Status 11/08/2020 FINAL  Final  Resp Panel by RT-PCR (Flu A&B, Covid) Nasopharyngeal Swab     Status: Abnormal   Collection Time: 11/03/20  9:45 PM   Specimen: Nasopharyngeal Swab; Nasopharyngeal(NP) swabs in vial transport medium  Result Value Ref Range Status   SARS Coronavirus 2 by RT PCR POSITIVE (A) NEGATIVE Final    Comment: RESULT CALLED TO, READ BACK BY AND VERIFIED WITH: Heloise Purpura RN 11/03/20 0037 JDW (NOTE) SARS-CoV-2 target nucleic acids are DETECTED.  The SARS-CoV-2 RNA is generally detectable in upper respiratory specimens during the acute phase of infection. Positive results are indicative of the presence of the identified virus, but do not rule out bacterial infection or co-infection with other pathogens not detected by the test. Clinical correlation with patient history and other diagnostic information is necessary to determine patient infection status. The expected result is Negative.  Fact Sheet for Patients: BloggerCourse.com  Fact Sheet for Healthcare Providers: SeriousBroker.it  This test is not yet approved or cleared by the [place] FDA and  has been authorized for detection and/or diagnosis of SARS-CoV-2 by FDA under an Emergency Use Authorization (EUA).  This EUA will remain in effect (meaning this test can be u sed) for the duration of  the COVID-19 declaration under Section 564(b)(1) of the Act, 21 U.S.C. section 360bbb-3(b)(1), unless the authorization is terminated or revoked sooner.     Influenza A by PCR NEGATIVE NEGATIVE Final   Influenza B by PCR NEGATIVE NEGATIVE Final    Comment: (NOTE) The Xpert Xpress SARS-CoV-2/FLU/RSV plus assay is intended as an aid in the diagnosis of influenza from Nasopharyngeal swab specimens  and should not be used as a sole basis for treatment. Nasal washings and aspirates are unacceptable for Xpert Xpress SARS-CoV-2/FLU/RSV testing.  Fact Sheet for Patients: BloggerCourse.com  Fact Sheet for Healthcare Providers: SeriousBroker.it  This test is not yet approved or  cleared by the [place] and has been authorized for detection and/or diagnosis of SARS-CoV-2 by FDA under an Emergency Use Authorization (EUA). This EUA will remain in effect (meaning this test can be used) for the duration of the COVID-19 declaration under Section 564(b)(1) of the Act, 21 U.S.C. section 360bbb-3(b)(1), unless the authorization is terminated or revoked.  Performed at [facility], [address], [place], [place] [address]       Radiology Studies: VAS [place] LOWER EXTREMITY VENOUS (DVT)  Result Date: 11/08/2020  Lower Venous DVT Study Indications: Covid-19, elevated D-Dimer.  Comparison Study: No prior study Performing Technologist: Sherren Kerns RVS  Examination Guidelines: A complete evaluation includes B-mode imaging, spectral Doppler, color Doppler, and power Doppler as needed of all accessible portions of each vessel. Bilateral testing is considered an integral part of a complete examination. Limited examinations for reoccurring indications may be performed as noted. The reflux portion of the exam is performed with the patient in reverse Trendelenburg.  +---------+---------------+---------+-----------+----------+--------------+ RIGHT    CompressibilityPhasicitySpontaneityPropertiesThrombus Aging +---------+---------------+---------+-----------+----------+--------------+ CFV      Full           Yes      Yes                                 +---------+---------------+---------+-----------+----------+--------------+ SFJ      Full                                                         +---------+---------------+---------+-----------+----------+--------------+ FV Prox  Full                                                        +---------+---------------+---------+-----------+----------+--------------+ FV Mid   Full                                                        +---------+---------------+---------+-----------+----------+--------------+ FV DistalFull                                                        +---------+---------------+---------+-----------+----------+--------------+ PFV      Full                                                        +---------+---------------+---------+-----------+----------+--------------+ POP      Full           Yes      Yes                                 +---------+---------------+---------+-----------+----------+--------------+  PTV      Full                                                        +---------+---------------+---------+-----------+----------+--------------+ PERO     Full                                                        +---------+---------------+---------+-----------+----------+--------------+   +---------+---------------+---------+-----------+----------+--------------+ LEFT     CompressibilityPhasicitySpontaneityPropertiesThrombus Aging +---------+---------------+---------+-----------+----------+--------------+ CFV      Full           Yes      Yes                                 +---------+---------------+---------+-----------+----------+--------------+ SFJ      Full                                                        +---------+---------------+---------+-----------+----------+--------------+ FV Prox  Full                                                        +---------+---------------+---------+-----------+----------+--------------+ FV Mid   Full                                                         +---------+---------------+---------+-----------+----------+--------------+ FV DistalFull                                                        +---------+---------------+---------+-----------+----------+--------------+ PFV      Full                                                        +---------+---------------+---------+-----------+----------+--------------+ POP      Full           Yes      Yes                                 +---------+---------------+---------+-----------+----------+--------------+ PTV      Full                                                        +---------+---------------+---------+-----------+----------+--------------+  PERO     Full                                                        +---------+---------------+---------+-----------+----------+--------------+     Summary: BILATERAL: - No evidence of deep vein thrombosis seen in the lower extremities, bilaterally. -   *See table(s) above for measurements and observations.    Preliminary        LOS: 5 days   Reiss Mowrey [employer] on [email or web address]  11/08/2020, 12:43 PM

## 2020-11-09 LAB — CBC WITH DIFFERENTIAL/PLATELET
Abs Immature Granulocytes: 0.63 10*3/uL — ABNORMAL HIGH (ref 0.00–0.07)
Basophils Absolute: 0 10*3/uL (ref 0.0–0.1)
Basophils Relative: 0 %
Eosinophils Absolute: 0 10*3/uL (ref 0.0–0.5)
Eosinophils Relative: 0 %
HCT: 48.9 % (ref 39.0–52.0)
Hemoglobin: 16.4 g/dL (ref 13.0–17.0)
Immature Granulocytes: 5 %
Lymphocytes Relative: 4 %
Lymphs Abs: 0.5 10*3/uL — ABNORMAL LOW (ref 0.7–4.0)
MCH: 28.7 pg (ref 26.0–34.0)
MCHC: 33.5 g/dL (ref 30.0–36.0)
MCV: 85.5 fL (ref 80.0–100.0)
Monocytes Absolute: 0.6 10*3/uL (ref 0.1–1.0)
Monocytes Relative: 4 %
Neutro Abs: 10.7 10*3/uL — ABNORMAL HIGH (ref 1.7–7.7)
Neutrophils Relative %: 87 %
Platelets: 240 10*3/uL (ref 150–400)
RBC: 5.72 MIL/uL (ref 4.22–5.81)
RDW: 13.3 % (ref 11.5–15.5)
WBC: 12.4 10*3/uL — ABNORMAL HIGH (ref 4.0–10.5)
nRBC: 0.2 % (ref 0.0–0.2)

## 2020-11-09 LAB — COMPREHENSIVE METABOLIC PANEL
ALT: 54 U/L — ABNORMAL HIGH (ref 0–44)
AST: 28 U/L (ref 15–41)
Albumin: 2.7 g/dL — ABNORMAL LOW (ref 3.5–5.0)
Alkaline Phosphatase: 48 U/L (ref 38–126)
Anion gap: 11 (ref 5–15)
BUN: 13 mg/dL (ref 6–20)
CO2: 21 mmol/L — ABNORMAL LOW (ref 22–32)
Calcium: 8.1 mg/dL — ABNORMAL LOW (ref 8.9–10.3)
Chloride: 103 mmol/L (ref 98–111)
Creatinine, Ser: 0.97 mg/dL (ref 0.61–1.24)
GFR, Estimated: >60 mL/min (ref >60)
Glucose, Bld: 243 mg/dL — ABNORMAL HIGH (ref 70–99)
Potassium: 4 mmol/L (ref 3.5–5.1)
Sodium: 135 mmol/L (ref 135–145)
Total Bilirubin: 0.8 mg/dL (ref 0.3–1.2)
Total Protein: 5 g/dL — ABNORMAL LOW (ref 6.5–8.1)

## 2020-11-09 LAB — MAGNESIUM: Magnesium: 2.1 mg/dL (ref 1.7–2.4)

## 2020-11-09 LAB — D-DIMER, QUANTITATIVE: D-Dimer, Quant: 5.84 ug/mL-FEU — ABNORMAL HIGH (ref 0.00–0.50)

## 2020-11-09 LAB — C-REACTIVE PROTEIN: CRP: 1.1 mg/dL — ABNORMAL HIGH (ref <1.0)

## 2020-11-09 MED ORDER — LISINOPRIL 10 MG PO TABS
10.0000 mg | ORAL_TABLET | Freq: Every day | ORAL | Status: DC
  Administered 2020-11-10 – 2020-11-13 (×4): 10 mg via ORAL
  Filled 2020-11-09 (×4): qty 1

## 2020-11-09 MED ORDER — PAROXETINE HCL 10 MG PO TABS: 10.0000 mg | ORAL_TABLET | Freq: Every day | ORAL | Status: DC

## 2020-11-09 MED ORDER — ALPRAZOLAM 0.25 MG PO TABS
0.2500 mg | ORAL_TABLET | Freq: Two times a day (BID) | ORAL | Status: DC | PRN
  Administered 2020-11-09 – 2020-11-10 (×2): 0.25 mg via ORAL
  Filled 2020-11-09 (×2): qty 1

## 2020-11-09 MED ORDER — HYDROCHLOROTHIAZIDE 12.5 MG PO CAPS
12.5000 mg | ORAL_CAPSULE | Freq: Every day | ORAL | Status: DC
Start: 2020-11-10 — End: 2020-11-13
  Administered 2020-11-10 – 2020-11-13 (×4): 12.5 mg via ORAL
  Filled 2020-11-09 (×4): qty 1

## 2020-11-09 MED ORDER — LISINOPRIL-HYDROCHLOROTHIAZIDE 10-12.5 MG PO TABS: 1.0000 | ORAL_TABLET | Freq: Every morning | ORAL | Status: DC

## 2020-11-09 MED ORDER — SALINE SPRAY 0.65 % NA SOLN
1.0000 | NASAL | Status: DC | PRN
  Administered 2020-11-09: 1 via NASAL
  Filled 2020-11-09: qty 44

## 2020-11-09 NOTE — Plan of Care (Signed)
  Problem: Education: Goal: Knowledge of risk factors and measures for prevention of condition will improve Outcome: Progressing   Problem: Coping: Goal: Psychosocial and spiritual needs will be supported Outcome: Progressing   Problem: Respiratory: Goal: Will maintain a patent airway Outcome: Progressing Goal: Complications related to the disease process, condition or treatment will be avoided or minimized Outcome: Progressing   

## 2020-11-09 NOTE — Progress Notes (Signed)
PROGRESS NOTE  Justin Archer OEV:[id number] DOB: February 09, 1981 DOA: 11/03/2020  PCP: [facility]  Brief History/Interval Summary: 40 year old with a history of HTN, hypogonadism, and generalized anxiety disorder who was sent to [facility] ED from a local urgent care due to hypoxia.  He reports developing generalized weakness muscle aches and intermittent fevers 3 days before Christmas, leading him to present to an urgent care on 12/24 where he was diagnosed with Covid.  Since that time the patient has developed a severe cough and progressive shortness of breath.  Due to progression of his symptoms he return to the urgent care 1/1 for reevaluation when he was found to be hypoxic in the 80s and sent to the ED.  Reason for Visit: Pneumonia due to COVID-19.  Acute respiratory failure with hypoxia  Consultants: None  Procedures: None  Antibiotics: Anti-infectives (From admission, onward)   Start     Dose/Rate Route Frequency Ordered Stop   11/04/20 1000  remdesivir 100 mg in sodium chloride 0.9 % 100 mL IVPB       "Followed by" Linked Group Details   100 mg 200 mL/hr over 30 Minutes Intravenous Daily 11/03/20 1829 11/07/20 0935   11/03/20 1830  remdesivir 200 mg in sodium chloride 0.9% 250 mL IVPB       "Followed by" Linked Group Details   200 mg 580 mL/hr over 30 Minutes Intravenous Once 11/03/20 1829 11/03/20 2210      Subjective/Interval History: Patient mentions that he continues to feel better.  Less short of breath.  No nausea vomiting.  Continues to have dry cough.     Assessment/Plan:  Acute Hypoxic Resp. Failure/Pneumonia due to COVID-19   Recent Labs  Lab 11/03/20 2023 11/05/20 0609 11/06/20 0436 11/07/20 0421 11/08/20 0436 11/09/20 0500  DDIMER 1.03* 1.83* 3.45* 5.05* 6.45* 5.84*  FERRITIN 525*  --   --   --   --   --   CRP 12.0* 4.8* 1.9* 0.9 0.6 1.1*  ALT 49* 46* 71* 65* 61* 54*  PROCALCITON <0.10  --   --   --   --   --      Objective  findings: Oxygen requirements: Remains on high flow nasal cannula but down to 5 to 6 L of oxygen.  Saturating in the early 90s.    COVID 19 Therapeutics: Antibacterials: None Remdesivir: Completed course of Remdesivir Steroids: Remains on Solu-Medrol Diuretics: None yet Inhaled Steroids: Dulera Actemra: Given Actemra PUD Prophylaxis: Pepcid DVT Prophylaxis:  Lovenox  From a respiratory standpoint patient seems to be improving.  He is down to 5 to 6 L of oxygen by high flow nasal cannula.  Continue to wean down as tolerated.  Patient has completed course of Remdesivir.  Remains on steroids.  He was given Actemra.  Inflammatory markers significantly improved.    D-dimer peaked at 6.45 and seems to be getting better. CT angiogram done on 1/2 did not show any PE.  Lower extremity Doppler study did not show any DVT.  Elevated D-dimer most likely secondary to COVID-19.  Continue prophylactic doses of Lovenox.  Incentive spirometry mobilization.  Prone positioning as much as tolerated.  The treatment plan and use of medications and known side effects were discussed with patient/family. Some of the medications used are based on case reports/anecdotal data.  All other medications being used in the management of COVID-19 based on limited study data.  Complete risks and long-term side effects are unknown, however in the best clinical judgment  they seem to be of some benefit.  Patient/family wanted to proceed with treatment options provided.  Hyponatremia Corrected with fluids.  Mildly elevated transaminases Secondary to COVID-19.  Continue to trend.  Essential hypertension Blood pressure reasonably well controlled.  Currently not on any antihypertensives.  Generalized anxiety disorder Stable.  Continue to monitor.  Right thyroid nodule Incidentally noted on CT chest.  Will need outpatient work-up for same.  TSH and free T4 were normal.  Obesity Estimated body mass index is 42.8 kg/m as  calculated from the following:   Height as of 07/24/15: 6' 0.5" (1.842 m).   Weight as of 07/24/15: 145.2 kg.   DVT Prophylaxis: Lovenox Code Status: Full code Family Communication: Discussed with patient.  Wife being updated daily. Disposition Plan: Hopefully return home when improved  Status is: Inpatient  Remains inpatient appropriate because:IV treatments appropriate due to intensity of illness or inability to take PO and Inpatient level of care appropriate due to severity of illness   Dispo:  Patient From: Home  Planned Disposition: Home  Expected discharge date: 11/12/2020  Medically stable for discharge: No       Medications:  Scheduled: . vitamin C  500 mg Oral Daily  . enoxaparin (LOVENOX) injection  40 mg Subcutaneous Q24H  . famotidine  20 mg Oral Daily  . methylPREDNISolone (SOLU-MEDROL) injection  80 mg Intravenous Q12H  . mometasone-formoterol  2 puff Inhalation BID  . PARoxetine  10 mg Oral Daily  . zinc sulfate  220 mg Oral Daily   Continuous:  MGQ:[id number], albuterol, guaiFENesin-codeine, hydrALAZINE, ondansetron **OR** ondansetron (ZOFRAN) IV, polyethylene glycol   Objective:  Vital Signs  Vitals:   11/08/20 1333 11/08/20 1756 11/08/20 2102 11/08/20 2159  BP: 132/87   (!) 144/97  Pulse: 95   (!) 103  Resp: 20  (!) 28 20  Temp: 97.7 F (36.5 C)   98 F (36.7 C)  TempSrc:    Oral  SpO2: 92% 92%  94%    Intake/Output Summary (Last 24 hours) at 11/09/2020 1143 Last data filed at 11/09/2020 1100 Gross per 24 hour  Intake 840 ml  Output --  Net 840 ml   There were no vitals filed for this visit.   General appearance: Awake alert.  In no distress Resp: Mildly tachypneic at rest.  Coarse breath sounds bilaterally with few crackles at the bases.  No wheezing or rhonchi. Cardio: S1-S2 is normal regular.  No S3-S4.  No rubs murmurs or bruit GI: Abdomen is soft.  Nontender nondistended.  Bowel sounds are present normal.  No masses  organomegaly Extremities: No edema.  Full range of motion of lower extremities. Neurologic: Alert and oriented x3.  No focal neurological deficits.      Lab Results:  Data Reviewed: I have personally reviewed following labs and imaging studies  CBC: Recent Labs  Lab 11/05/20 0609 11/06/20 0436 11/07/20 0421 11/08/20 0436 11/09/20 0500  WBC 8.5 12.3* 11.6* 11.7* 12.4*  NEUTROABS 7.0 10.5* 10.2* 10.3* 10.7*  HGB 15.8 16.1 15.3 15.8 16.4  HCT 46.7 49.0 45.6 47.1 48.9  MCV 86.3 85.8 85.9 85.8 85.5  PLT 231 264 235 236 240    Basic Metabolic Panel: Recent Labs  Lab 11/05/20 0609 11/06/20 0436 11/07/20 0421 11/08/20 0436 11/09/20 0500  NA 137 138 137 136 135  K 4.1 4.0 4.1 4.0 4.0  CL 102 102 102 102 103  CO2 21* 23 25 22  21*  GLUCOSE 181* 146* 179* 167* 243*  BUN 9  12 15 15 13   CREATININE 0.78 0.82 0.89 0.86 0.97  CALCIUM 8.2* 8.4* 8.2* 8.0* 8.1*  MG 1.7 2.0 2.0 2.1 2.1    GFR: CrCl cannot be calculated (Unknown ideal weight.).  Liver Function Tests: Recent Labs  Lab 11/05/20 01/03/21 11/06/20 0436 11/07/20 0421 11/08/20 0436 11/09/20 0500  AST 74* 77* 49* 31 28  ALT 46* 71* 65* 61* 54*  ALKPHOS 44 49 48 50 48  BILITOT 0.8 1.1 1.0 0.8 0.8  PROT 5.6* 5.6* 5.3* 5.1* 5.0*  ALBUMIN 2.6* 2.9* 2.7* 2.7* 2.7*     Recent Results (from the past 240 hour(s))  Blood Culture (routine x 2)     Status: None   Collection Time: 11/03/20  8:22 PM   Specimen: BLOOD  Result Value Ref Range Status   Specimen Description BLOOD RIGHT ANTECUBITAL  Final   Special Requests   Final    BOTTLES DRAWN AEROBIC AND ANAEROBIC Blood Culture adequate volume   Culture   Final    NO GROWTH 5 DAYS Performed at [facility], [address] [place], [place]    Report Status 11/08/2020 FINAL  Final  Blood Culture (routine x 2)     Status: None   Collection Time: 11/03/20  8:23 PM   Specimen: BLOOD  Result Value Ref Range Status   Specimen Description BLOOD LEFT  ANTECUBITAL  Final   Special Requests   Final    BOTTLES DRAWN AEROBIC AND ANAEROBIC Blood Culture adequate volume   Culture   Final    NO GROWTH 5 DAYS Performed at [facility], [address], [place], [place]    Report Status 11/08/2020 FINAL  Final  Resp Panel by RT-PCR (Flu A&B, Covid) Nasopharyngeal Swab     Status: Abnormal   Collection Time: 11/03/20  9:45 PM   Specimen: Nasopharyngeal Swab; Nasopharyngeal(NP) swabs in vial transport medium  Result Value Ref Range Status   SARS Coronavirus 2 by RT PCR POSITIVE (A) NEGATIVE Final    Comment: RESULT CALLED TO, READ BACK BY AND VERIFIED WITH: 01/01/21 RN 11/03/20 0037 JDW (NOTE) SARS-CoV-2 target nucleic acids are DETECTED.  The SARS-CoV-2 RNA is generally detectable in upper respiratory specimens during the acute phase of infection. Positive results are indicative of the presence of the identified virus, but do not rule out bacterial infection or co-infection with other pathogens not detected by the test. Clinical correlation with patient history and other diagnostic information is necessary to determine patient infection status. The expected result is Negative.  Fact Sheet for Patients: 01/01/21  Fact Sheet for Healthcare Providers: BloggerCourse.com  This test is not yet approved or cleared by the [place] FDA and  has been authorized for detection and/or diagnosis of SARS-CoV-2 by FDA under an Emergency Use Authorization (EUA).  This EUA will remain in effect (meaning this test can be u sed) for the duration of  the COVID-19 declaration under Section 564(b)(1) of the Act, 21 U.S.C. section 360bbb-3(b)(1), unless the authorization is terminated or revoked sooner.     Influenza A by PCR NEGATIVE NEGATIVE Final   Influenza B by PCR NEGATIVE NEGATIVE Final    Comment: (NOTE) The Xpert Xpress SARS-CoV-2/FLU/RSV plus assay is intended as an  aid in the diagnosis of influenza from Nasopharyngeal swab specimens and should not be used as a sole basis for treatment. Nasal washings and aspirates are unacceptable for Xpert Xpress SARS-CoV-2/FLU/RSV testing.  Fact Sheet for Patients: [place]  Fact Sheet for Healthcare Providers: SeriousBroker.it  This test is not yet approved or cleared by the [place] FDA and has been authorized for detection and/or diagnosis of SARS-CoV-2 by FDA under an Emergency Use Authorization (EUA). This EUA will remain in effect (meaning this test can be used) for the duration of the COVID-19 declaration under Section 564(b)(1) of the Act, 21 U.S.C. section 360bbb-3(b)(1), unless the authorization is terminated or revoked.  Performed at [facility], [address] [place], [place] [address]       Radiology Studies: VAS [place] LOWER EXTREMITY VENOUS (DVT)  Result Date: 11/08/2020  Lower Venous DVT Study Indications: Covid-19, elevated D-Dimer.  Comparison Study: No prior study Performing Technologist: Sherren Kerns RVS  Examination Guidelines: A complete evaluation includes B-mode imaging, spectral Doppler, color Doppler, and power Doppler as needed of all accessible portions of each vessel. Bilateral testing is considered an integral part of a complete examination. Limited examinations for reoccurring indications may be performed as noted. The reflux portion of the exam is performed with the patient in reverse Trendelenburg.  +---------+---------------+---------+-----------+----------+--------------+ RIGHT    CompressibilityPhasicitySpontaneityPropertiesThrombus Aging +---------+---------------+---------+-----------+----------+--------------+ CFV      Full           Yes      Yes                                 +---------+---------------+---------+-----------+----------+--------------+ SFJ      Full                                                         +---------+---------------+---------+-----------+----------+--------------+ FV Prox  Full                                                        +---------+---------------+---------+-----------+----------+--------------+ FV Mid   Full                                                        +---------+---------------+---------+-----------+----------+--------------+ FV DistalFull                                                        +---------+---------------+---------+-----------+----------+--------------+ PFV      Full                                                        +---------+---------------+---------+-----------+----------+--------------+ POP      Full           Yes      Yes                                 +---------+---------------+---------+-----------+----------+--------------+  PTV      Full                                                        +---------+---------------+---------+-----------+----------+--------------+ PERO     Full                                                        +---------+---------------+---------+-----------+----------+--------------+   +---------+---------------+---------+-----------+----------+--------------+ LEFT     CompressibilityPhasicitySpontaneityPropertiesThrombus Aging +---------+---------------+---------+-----------+----------+--------------+ CFV      Full           Yes      Yes                                 +---------+---------------+---------+-----------+----------+--------------+ SFJ      Full                                                        +---------+---------------+---------+-----------+----------+--------------+ FV Prox  Full                                                        +---------+---------------+---------+-----------+----------+--------------+ FV Mid   Full                                                         +---------+---------------+---------+-----------+----------+--------------+ FV DistalFull                                                        +---------+---------------+---------+-----------+----------+--------------+ PFV      Full                                                        +---------+---------------+---------+-----------+----------+--------------+ POP      Full           Yes      Yes                                 +---------+---------------+---------+-----------+----------+--------------+ PTV      Full                                                        +---------+---------------+---------+-----------+----------+--------------+  PERO     Full                                                        +---------+---------------+---------+-----------+----------+--------------+     Summary: BILATERAL: - No evidence of deep vein thrombosis seen in the lower extremities, bilaterally. -   *See table(s) above for measurements and observations. Electronically signed by Harold Barban MD on 11/08/2020 at 3:34:42 PM.    Final        LOS: 6 days   [facility] Pager on [email or web address]  11/09/2020, 11:43 AM

## 2020-11-10 ENCOUNTER — Inpatient Hospital Stay (HOSPITAL_COMMUNITY): Payer: BC Managed Care – PPO

## 2020-11-10 LAB — BASIC METABOLIC PANEL
Anion gap: 12 (ref 5–15)
BUN: 14 mg/dL (ref 6–20)
CO2: 24 mmol/L (ref 22–32)
Calcium: 8.3 mg/dL — ABNORMAL LOW (ref 8.9–10.3)
Chloride: 101 mmol/L (ref 98–111)
Creatinine, Ser: 0.9 mg/dL (ref 0.61–1.24)
GFR, Estimated: >60 mL/min (ref >60)
Glucose, Bld: 220 mg/dL — ABNORMAL HIGH (ref 70–99)
Potassium: 4.5 mmol/L (ref 3.5–5.1)
Sodium: 137 mmol/L (ref 135–145)

## 2020-11-10 LAB — D-DIMER, QUANTITATIVE: D-Dimer, Quant: 3.05 ug/mL-FEU — ABNORMAL HIGH (ref 0.00–0.50)

## 2020-11-10 MED ORDER — FUROSEMIDE 10 MG/ML IJ SOLN
40.0000 mg | Freq: Once | INTRAMUSCULAR | Status: AC
  Administered 2020-11-10: 40 mg via INTRAVENOUS
  Filled 2020-11-10 (×2): qty 4

## 2020-11-10 MED ORDER — ALPRAZOLAM 0.25 MG PO TABS
0.2500 mg | ORAL_TABLET | Freq: Three times a day (TID) | ORAL | Status: DC | PRN
  Administered 2020-11-10 – 2020-11-13 (×5): 0.25 mg via ORAL
  Filled 2020-11-10 (×5): qty 1

## 2020-11-10 NOTE — Progress Notes (Signed)
PROGRESS NOTE  Justin Archer ZOX:[id number] DOB: 10/01/1981 DOA: 11/03/2020  [facility]  Brief History/Interval Summary: 40 year old with a history of HTN, hypogonadism, and generalized anxiety disorder who was sent to [facility] ED from a local urgent care due to hypoxia.  He reports developing generalized weakness muscle aches and intermittent fevers 3 days before Christmas, leading him to present to an urgent care on 12/24 where he was diagnosed with Covid.  Since that time the patient has developed a severe cough and progressive shortness of breath.  Due to progression of his symptoms he return to the urgent care 1/1 for reevaluation when he was found to be hypoxic in the 80s and sent to the ED.  Reason for Visit: Pneumonia due to COVID-19.  Acute respiratory failure with hypoxia  Consultants: None  Procedures: None  Antibiotics: Anti-infectives (From admission, onward)   Start     Dose/Rate Route Frequency Ordered Stop   11/04/20 1000  remdesivir 100 mg in sodium chloride 0.9 % 100 mL IVPB       "Followed by" Linked Group Details   100 mg 200 mL/hr over 30 Minutes Intravenous Daily 11/03/20 1829 11/07/20 0935   11/03/20 1830  remdesivir 200 mg in sodium chloride 0.9% 250 mL IVPB       "Followed by" Linked Group Details   200 mg 580 mL/hr over 30 Minutes Intravenous Once 11/03/20 1829 11/03/20 2210      Subjective/Interval History: Noted to be very anxious this morning.  He is concerned about his oxygen saturation levels.  Otherwise he mentions that he does not have any worsening shortness of breath today compared to yesterday.  No nausea vomiting.  Continues to have a dry cough.     Assessment/Plan:  Acute Hypoxic Resp. Failure/Pneumonia due to COVID-19   Recent Labs  Lab 11/03/20 2023 11/05/20 0609 11/06/20 0436 11/07/20 0421 11/08/20 0436 11/09/20 0500 11/10/20 0316  DDIMER 1.03* 1.83* 3.45* 5.05* 6.45* 5.84* 3.05*  FERRITIN 525*  --   --   --    --   --   --   CRP 12.0* 4.8* 1.9* 0.9 0.6 1.1*  --   ALT 49* 46* 71* 65* 61* 54*  --   PROCALCITON <0.10  --   --   --   --   --   --      Objective findings: Oxygen requirements: Remains on high flow nasal cannula.  Noted to be up to 8 to 9 L today compared to the 516 he was on yesterday.  Saturations noted to be in the early 90s   COVID 19 Therapeutics: Antibacterials: None Remdesivir: Completed course of Remdesivir Steroids: Remains on Solu-Medrol Diuretics: None yet Inhaled Steroids: Dulera Actemra: Given Actemra PUD Prophylaxis: Pepcid DVT Prophylaxis:  Lovenox  Requiring more oxygen today compared to yesterday.  Chest x-ray was repeated which shows slight early more prominent bilateral infiltrates.  He has completed course of Remdesivir.  Remains on Solu-Medrol.  He was given Actemra.  He is on inhaled steroids.  We will give him a dose of Lasix today.  Inflammatory markers have improved.  D-dimer also improving.   D-dimer peaked at 6.45. CT angiogram done on 1/2 did not show any PE.  Lower extremity Doppler study did not show any DVT.  Elevated D-dimer most likely secondary to COVID-19.  Continue prophylactic doses of Lovenox.  Incentive spirometry mobilization.  Prone positioning as much as tolerated.  The treatment plan and use of medications  and known side effects were discussed with patient/family. Some of the medications used are based on case reports/anecdotal data.  All other medications being used in the management of COVID-19 based on limited study data.  Complete risks and long-term side effects are unknown, however in the best clinical judgment they seem to be of some benefit.  Patient/family wanted to proceed with treatment options provided.  Hyponatremia Corrected with fluids.  Mildly elevated transaminases Secondary to COVID-19.  Improved.  Essential hypertension Blood pressure reasonably well controlled.  Currently not on any  antihypertensives.  Generalized anxiety disorder Stable.  Continue to monitor.  Xanax as needed.  Right thyroid nodule Incidentally noted on CT chest.  Will need outpatient work-up for same.  TSH and free T4 were normal.  Obesity Estimated body mass index is 42.79 kg/m as calculated from the following:   Height as of this encounter: 6' 0.52" (1.842 m).   Weight as of this encounter: 145.2 kg.   DVT Prophylaxis: Lovenox Code Status: Full code Family Communication: Discussed with patient.  Wife being updated daily. Disposition Plan: Hopefully return home when improved  Status is: Inpatient  Remains inpatient appropriate because:IV treatments appropriate due to intensity of illness or inability to take PO and Inpatient level of care appropriate due to severity of illness   Dispo:  Patient From: Home  Planned Disposition: Home  Expected discharge date: 11/12/2020  Medically stable for discharge: No       Medications:  Scheduled: . vitamin C  500 mg Oral Daily  . enoxaparin (LOVENOX) injection  40 mg Subcutaneous Q24H  . famotidine  20 mg Oral Daily  . lisinopril  10 mg Oral Daily   And  . hydrochlorothiazide  12.5 mg Oral Daily  . methylPREDNISolone (SOLU-MEDROL) injection  80 mg Intravenous Q12H  . mometasone-formoterol  2 puff Inhalation BID  . PARoxetine  10 mg Oral Daily  . zinc sulfate  220 mg Oral Daily   Continuous:  KKX:[id number], albuterol, ALPRAZolam, guaiFENesin-codeine, hydrALAZINE, ondansetron **OR** ondansetron (ZOFRAN) IV, polyethylene glycol, sodium chloride   Objective:  Vital Signs  Vitals:   11/10/20 0850 11/10/20 0911 11/10/20 1155 11/10/20 1320  BP:  (!) 157/101 121/87   Pulse:  (!) 114 92   Resp:   18   Temp:   98.3 F (36.8 C)   TempSrc:      SpO2: 93%  93% 91%  Weight:      Height:       No intake or output data in the 24 hours ending 11/10/20 1323 Filed Weights   11/09/20 1500  Weight: (!) 145.2 kg    General  appearance: Awake alert.  In no distress.  Anxious Resp: Mildly tachypneic at rest.  Crackles bilateral bases.  No wheezing or rhonchi.   Cardio: S1-S2 is tachycardic regular.  No S3-S4.  No rubs murmurs or bruit GI: Abdomen is soft.  Nontender nondistended.  Bowel sounds are present normal.  No masses organomegaly Extremities: No edema.  Full range of motion of lower extremities. Neurologic: Alert and oriented x3.  No focal neurological deficits.      Lab Results:  Data Reviewed: I have personally reviewed following labs and imaging studies  CBC: Recent Labs  Lab 11/05/20 0609 11/06/20 0436 11/07/20 0421 11/08/20 0436 11/09/20 0500  WBC 8.5 12.3* 11.6* 11.7* 12.4*  NEUTROABS 7.0 10.5* 10.2* 10.3* 10.7*  HGB 15.8 16.1 15.3 15.8 16.4  HCT 46.7 49.0 45.6 47.1 48.9  MCV 86.3 85.8 85.9 85.8 85.5  PLT 231 264 235 236 240    Basic Metabolic Panel: Recent Labs  Lab 11/05/20 0609 11/06/20 0436 11/07/20 0421 11/08/20 0436 11/09/20 0500 11/10/20 0316  NA 137 138 137 136 135 137  K 4.1 4.0 4.1 4.0 4.0 4.5  CL 102 102 102 102 103 101  CO2 21* 23 25 22  21* 24  GLUCOSE 181* 146* 179* 167* 243* 220*  BUN 9 12 15 15 13 14   CREATININE 0.78 0.82 0.89 0.86 0.97 0.90  CALCIUM 8.2* 8.4* 8.2* 8.0* 8.1* 8.3*  MG 1.7 2.0 2.0 2.1 2.1  --     GFR: Estimated Creatinine Clearance: 167.5 mL/min (by C-G formula based on SCr of 0.9 mg/dL).  Liver Function Tests: Recent Labs  Lab 11/05/20 11/06/20 0436 11/07/20 0421 11/08/20 0436 11/09/20 0500  AST 74* 77* 49* 31 28  ALT 46* 71* 65* 61* 54*  ALKPHOS 44 49 48 50 48  BILITOT 0.8 1.1 1.0 0.8 0.8  PROT 5.6* 5.6* 5.3* 5.1* 5.0*  ALBUMIN 2.6* 2.9* 2.7* 2.7* 2.7*     Recent Results (from the past 240 hour(s))  Blood Culture (routine x 2)     Status: None   Collection Time: 11/03/20  8:22 PM   Specimen: BLOOD  Result Value Ref Range Status   Specimen Description BLOOD RIGHT ANTECUBITAL  Final   Special Requests   Final     BOTTLES DRAWN AEROBIC AND ANAEROBIC Blood Culture adequate volume   Culture   Final    NO GROWTH 5 DAYS Performed at [facility], [address] [place], [address] [place]    Report Status 11/08/2020 FINAL  Final  Blood Culture (routine x 2)     Status: None   Collection Time: 11/03/20  8:23 PM   Specimen: BLOOD  Result Value Ref Range Status   Specimen Description BLOOD LEFT ANTECUBITAL  Final   Special Requests   Final    BOTTLES DRAWN AEROBIC AND ANAEROBIC Blood Culture adequate volume   Culture   Final    NO GROWTH 5 DAYS Performed at [facility], [address], [place], [address] [place]    Report Status 11/08/2020 FINAL  Final  Resp Panel by RT-PCR (Flu A&B, Covid) Nasopharyngeal Swab     Status: Abnormal   Collection Time: 11/03/20  9:45 PM   Specimen: Nasopharyngeal Swab; Nasopharyngeal(NP) swabs in vial transport medium  Result Value Ref Range Status   SARS Coronavirus 2 by RT PCR POSITIVE (A) NEGATIVE Final    Comment: RESULT CALLED TO, READ BACK BY AND VERIFIED WITH: 01/06/2021 RN 11/03/20 0037 JDW (NOTE) SARS-CoV-2 target nucleic acids are DETECTED.  The SARS-CoV-2 RNA is generally detectable in upper respiratory specimens during the acute phase of infection. Positive results are indicative of the presence of the identified virus, but do not rule out bacterial infection or co-infection with other pathogens not detected by the test. Clinical correlation with patient history and other diagnostic information is necessary to determine patient infection status. The expected result is Negative.  Fact Sheet for Patients: Heloise Purpura  Fact Sheet for Healthcare Providers: 01/01/21  This test is not yet approved or cleared by the [employer] FDA and  has been authorized for detection and/or diagnosis of SARS-CoV-2 by FDA under an Emergency Use Authorization (EUA).  This EUA will remain in  effect (meaning this test can be u sed) for the duration of  the COVID-19 declaration under Section 564(b)(1) of the Act, 21 U.S.C.  section 360bbb-3(b)(1), unless the authorization is terminated or revoked sooner.     Influenza A by PCR NEGATIVE NEGATIVE Final   Influenza B by PCR NEGATIVE NEGATIVE Final    Comment: (NOTE) The Xpert Xpress SARS-CoV-2/FLU/RSV plus assay is intended as an aid in the diagnosis of influenza from Nasopharyngeal swab specimens and should not be used as a sole basis for treatment. Nasal washings and aspirates are unacceptable for Xpert Xpress SARS-CoV-2/FLU/RSV testing.  Fact Sheet for Patients: [employer]  Fact Sheet for Healthcare Providers: Serious[employer]ker.it  This test is not yet approved or cleared by the [place] FDA and has been authorized for detection and/or diagnosis of SARS-CoV-2 by FDA under an Emergency Use Authorization (EUA). This EUA will remain in effect (meaning this test can be used) for the duration of the COVID-19 declaration under Section 564(b)(1) of the Act, 21 U.S.C. section 360bbb-3(b)(1), unless the authorization is terminated or revoked.  Performed at [facility], [address] [place], [place] [address]       Radiology Studies: DG Chest Port 1 View  Result Date: 11/10/2020 CLINICAL DATA:  Hypoxemia.  COVID 19 infection. EXAM: PORTABLE CHEST 1 VIEW COMPARISON:  November 06, 2020, November 04, 2020 FINDINGS: The cardiomediastinal silhouette is unchanged in contour. No pleural effusion. No pneumothorax. LEFT greater than RIGHT multifocal bilateral peripheral predominant heterogeneous opacities, consistent with the sequela of COVID-19 infection. These are mildly increased in comparison to most recent prior. Visualized abdomen is unremarkable. Levocurvature of the superior thoracic spine, unchanged. IMPRESSION: Mild worsening of bilateral multifocal peripheral  predominant heterogeneous opacities consistent with COVID-19 infection. Electronically Signed   By: Meda Klinefelter MD   On: 11/10/2020 10:59       LOS: 7 days   Osvaldo Shipper  [facility] Pager on [email or web address]  11/10/2020, 1:23 PM

## 2020-11-11 LAB — BASIC METABOLIC PANEL
Anion gap: 14 (ref 5–15)
BUN: 20 mg/dL (ref 6–20)
CO2: 22 mmol/L (ref 22–32)
Calcium: 8.6 mg/dL — ABNORMAL LOW (ref 8.9–10.3)
Chloride: 98 mmol/L (ref 98–111)
Creatinine, Ser: 0.99 mg/dL (ref 0.61–1.24)
GFR, Estimated: >60 mL/min (ref >60)
Glucose, Bld: 166 mg/dL — ABNORMAL HIGH (ref 70–99)
Potassium: 4.7 mmol/L (ref 3.5–5.1)
Sodium: 134 mmol/L — ABNORMAL LOW (ref 135–145)

## 2020-11-11 LAB — D-DIMER, QUANTITATIVE: D-Dimer, Quant: 1.56 ug/mL-FEU — ABNORMAL HIGH (ref 0.00–0.50)

## 2020-11-11 MED ORDER — FUROSEMIDE 10 MG/ML IJ SOLN
40.0000 mg | Freq: Once | INTRAMUSCULAR | Status: AC
  Administered 2020-11-11: 40 mg via INTRAVENOUS
  Filled 2020-11-11: qty 4

## 2020-11-11 MED ORDER — PAROXETINE HCL 20 MG PO TABS
20.0000 mg | ORAL_TABLET | Freq: Every day | ORAL | Status: DC
  Administered 2020-11-12 – 2020-11-13 (×2): 20 mg via ORAL
  Filled 2020-11-11 (×2): qty 1

## 2020-11-11 NOTE — Progress Notes (Signed)
PROGRESS NOTE  NIJEL FLINK YBO:[id number] DOB: 1980/11/07 DOA: 11/03/2020  PCP: [facility]  Brief History/Interval Summary: 40 year old with a history of HTN, hypogonadism, and generalized anxiety disorder who was sent to [facility] ED from a local urgent care due to hypoxia.  He reports developing generalized weakness muscle aches and intermittent fevers 3 days before Christmas, leading him to present to an urgent care on 12/24 where he was diagnosed with Covid.  Since that time the patient has developed a severe cough and progressive shortness of breath.  Due to progression of his symptoms he return to the urgent care 1/1 for reevaluation when he was found to be hypoxic in the 80s and sent to the ED.  Reason for Visit: Pneumonia due to COVID-19.  Acute respiratory failure with hypoxia  Consultants: None  Procedures: None  Antibiotics: Anti-infectives (From admission, onward)   Start     Dose/Rate Route Frequency Ordered Stop   11/04/20 1000  remdesivir 100 mg in sodium chloride 0.9 % 100 mL IVPB       "Followed by" Linked Group Details   100 mg 200 mL/hr over 30 Minutes Intravenous Daily 11/03/20 1829 11/07/20 0935   11/03/20 1830  remdesivir 200 mg in sodium chloride 0.9% 250 mL IVPB       "Followed by" Linked Group Details   200 mg 580 mL/hr over 30 Minutes Intravenous Once 11/03/20 1829 11/03/20 2210      Subjective/Interval History: Patient mentions that he is feeling better.  Noted to be anxious but less compared to yesterday.  Noted to be tachycardic.  Patient mentions that he has been pacing around in the room.  Continues to have a dry cough.   Assessment/Plan:  Acute Hypoxic Resp. Failure/Pneumonia due to COVID-19   Recent Labs  Lab 11/05/20 0609 11/06/20 0436 11/07/20 0421 11/08/20 0436 11/09/20 0500 11/10/20 0316 11/11/20 0623  DDIMER 1.83* 3.45* 5.05* 6.45* 5.84* 3.05* 1.56*  CRP 4.8* 1.9* 0.9 0.6 1.1*  --   --   ALT 46* 71* 65* 61* 54*  --    --      Objective findings: Oxygen requirements: Remains on high flow nasal cannula.  Nursing staff attempting to wean him down.  Noted to be on 7 to 8 L this morning.  Dropped down to 5 L.  She is noted to be in the early to mid 90s   COVID 19 Therapeutics: Antibacterials: None Remdesivir: Completed course of Remdesivir Steroids: Remains on Solu-Medrol Diuretics: Was given 1 dose of furosemide yesterday Inhaled Steroids: Dulera Actemra: Given Actemra PUD Prophylaxis: Pepcid DVT Prophylaxis:  Lovenox  Respiratory standpoint patient remains tenuous.  Subjectively he is feeling better.  Hopefully we can titrate his oxygen down further today.  Patient has completed course of Remdesivir.  Remains on Solu-Medrol.  He was also given Actemra.  He is also on inhaled steroids.  Was given a dose of Lasix yesterday.  Noted to be tachycardic today which could be due to exertion as well as anxiety.  If it gets better we will give additional dose of furosemide today.  Mild hyponatremia noted.  Inflammatory markers have been improving.  D-dimer also also better.  D-dimer peaked at 6.45. CT angiogram done on 1/2 did not show any PE.  Lower extremity Doppler study did not show any DVT.  Elevated D-dimer most likely secondary to COVID-19.  Continue prophylactic doses of Lovenox.  Incentive spirometry and mobilization.  Prone positioning as much as tolerated.  The  treatment plan and use of medications and known side effects were discussed with patient/family. Some of the medications used are based on case reports/anecdotal data.  All other medications being used in the management of COVID-19 based on limited study data.  Complete risks and long-term side effects are unknown, however in the best clinical judgment they seem to be of some benefit.  Patient/family wanted to proceed with treatment options provided.  Hyponatremia Stable  Mildly elevated transaminases Secondary to COVID-19.   Improved.  Essential hypertension/sinus tachycardia Blood pressure reasonably well controlled.  Currently not on any antihypertensives.  Generalized anxiety disorder Stable.  Continue to monitor.  Xanax as needed.  Continue paroxetine.  Consider increasing the dose.  Right thyroid nodule Incidentally noted on CT chest.  Will need outpatient work-up for same.  TSH and free T4 were normal.  Obesity Estimated body mass index is 42.79 kg/m as calculated from the following:   Height as of this encounter: 6' 0.52" (1.842 m).   Weight as of this encounter: 145.2 kg.   DVT Prophylaxis: Lovenox Code Status: Full code Family Communication: Wife being updated daily. Disposition Plan: Hopefully return home when improved  Status is: Inpatient  Remains inpatient appropriate because:IV treatments appropriate due to intensity of illness or inability to take PO and Inpatient level of care appropriate due to severity of illness   Dispo:  Patient From: Home  Planned Disposition: Home  Expected discharge date: 11/12/2020  Medically stable for discharge: No       Medications:  Scheduled: . vitamin C  500 mg Oral Daily  . enoxaparin (LOVENOX) injection  40 mg Subcutaneous Q24H  . famotidine  20 mg Oral Daily  . lisinopril  10 mg Oral Daily   And  . hydrochlorothiazide  12.5 mg Oral Daily  . methylPREDNISolone (SOLU-MEDROL) injection  80 mg Intravenous Q12H  . mometasone-formoterol  2 puff Inhalation BID  . PARoxetine  10 mg Oral Daily  . zinc sulfate  220 mg Oral Daily   Continuous:  TKZ:[id number], albuterol, ALPRAZolam, guaiFENesin-codeine, hydrALAZINE, ondansetron **OR** ondansetron (ZOFRAN) IV, polyethylene glycol, sodium chloride   Objective:  Vital Signs  Vitals:   11/10/20 2015 11/11/20 0531 11/11/20 0715 11/11/20 1204  BP: 131/76 110/68  125/78  Pulse: (!) 115 88  (!) 109  Resp: 20 20 20 18   Temp: 98.5 F (36.9 C) 98.6 F (37 C)  98.1 F (36.7 C)  TempSrc:  Oral Oral    SpO2: 99% 98% 99% 95%  Weight:      Height:        Intake/Output Summary (Last 24 hours) at 11/11/2020 1259 Last data filed at 11/11/2020 0900 Gross per 24 hour  Intake 1560 ml  Output --  Net 1560 ml   Filed Weights   11/09/20 1500  Weight: (!) 145.2 kg    General appearance: Awake alert.  In no distress.  Noted to be anxious but less so compared to yesterday Resp: Normal effort at rest but gets tachypneic with exertion.  Crackles bilateral bases.  No wheezing or rhonchi Cardio: S1-S2 is tachycardic regular.  No S3-S4.  No rubs murmurs or bruit GI: Abdomen is soft.  Nontender nondistended.  Bowel sounds are present normal.  No masses organomegaly Extremities: No edema.  Full range of motion of lower extremities. Neurologic: Alert and oriented x3.  No focal neurological deficits.     Lab Results:  Data Reviewed: I have personally reviewed following labs and imaging studies  CBC: Recent Labs  Lab 11/05/20 0609 11/06/20 0436 11/07/20 0421 11/08/20 0436 11/09/20 0500  WBC 8.5 12.3* 11.6* 11.7* 12.4*  NEUTROABS 7.0 10.5* 10.2* 10.3* 10.7*  HGB 15.8 16.1 15.3 15.8 16.4  HCT 46.7 49.0 45.6 47.1 48.9  MCV 86.3 85.8 85.9 85.8 85.5  PLT 231 264 235 236 240    Basic Metabolic Panel: Recent Labs  Lab 11/05/20 0609 11/06/20 0436 11/07/20 0421 11/08/20 0436 11/09/20 0500 11/10/20 0316 11/11/20 0623  NA 137 138 137 136 135 137 134*  K 4.1 4.0 4.1 4.0 4.0 4.5 4.7  CL 102 102 102 102 103 101 98  CO2 21* 23 25 22  21* 24 22  GLUCOSE 181* 146* 179* 167* 243* 220* 166*  BUN 9 12 15 15 13 14 20   CREATININE 0.78 0.82 0.89 0.86 0.97 0.90 0.99  CALCIUM 8.2* 8.4* 8.2* 8.0* 8.1* 8.3* 8.6*  MG 1.7 2.0 2.0 2.1 2.1  --   --     GFR: Estimated Creatinine Clearance: 152.3 mL/min (by C-G formula based on SCr of 0.99 mg/dL).  Liver Function Tests: Recent Labs  Lab 11/05/20 11/06/20 0436 11/07/20 0421 11/08/20 0436 11/09/20 0500  AST 74* 77* 49* 31 28  ALT  46* 71* 65* 61* 54*  ALKPHOS 44 49 48 50 48  BILITOT 0.8 1.1 1.0 0.8 0.8  PROT 5.6* 5.6* 5.3* 5.1* 5.0*  ALBUMIN 2.6* 2.9* 2.7* 2.7* 2.7*     Recent Results (from the past 240 hour(s))  Blood Culture (routine x 2)     Status: None   Collection Time: 11/03/20  8:22 PM   Specimen: BLOOD  Result Value Ref Range Status   Specimen Description BLOOD RIGHT ANTECUBITAL  Final   Special Requests   Final    BOTTLES DRAWN AEROBIC AND ANAEROBIC Blood Culture adequate volume   Culture   Final    NO GROWTH 5 DAYS Performed at [facility], [address], [place], [address] [place]    Report Status 11/08/2020 FINAL  Final  Blood Culture (routine x 2)     Status: None   Collection Time: 11/03/20  8:23 PM   Specimen: BLOOD  Result Value Ref Range Status   Specimen Description BLOOD LEFT ANTECUBITAL  Final   Special Requests   Final    BOTTLES DRAWN AEROBIC AND ANAEROBIC Blood Culture adequate volume   Culture   Final    NO GROWTH 5 DAYS Performed at [facility], [address], [place], [address] [place]    Report Status 11/08/2020 FINAL  Final  Resp Panel by RT-PCR (Flu A&B, Covid) Nasopharyngeal Swab     Status: Abnormal   Collection Time: 11/03/20  9:45 PM   Specimen: Nasopharyngeal Swab; Nasopharyngeal(NP) swabs in vial transport medium  Result Value Ref Range Status   SARS Coronavirus 2 by RT PCR POSITIVE (A) NEGATIVE Final    Comment: RESULT CALLED TO, READ BACK BY AND VERIFIED WITH: 01/06/2021 RN 11/03/20 0037 JDW (NOTE) SARS-CoV-2 target nucleic acids are DETECTED.  The SARS-CoV-2 RNA is generally detectable in upper respiratory specimens during the acute phase of infection. Positive results are indicative of the presence of the identified virus, but do not rule out bacterial infection or co-infection with other pathogens not detected by the test. Clinical correlation with patient history and other diagnostic information is necessary to determine  patient infection status. The expected result is Negative.  Fact Sheet for Patients: Heloise Purpura  Fact Sheet for Healthcare Providers: 01/01/21  This test  is not yet approved or cleared by the [place] FDA and  has been authorized for detection and/or diagnosis of SARS-CoV-2 by FDA under an Emergency Use Authorization (EUA).  This EUA will remain in effect (meaning this test can be u sed) for the duration of  the COVID-19 declaration under Section 564(b)(1) of the Act, 21 U.S.C. section 360bbb-3(b)(1), unless the authorization is terminated or revoked sooner.     Influenza A by PCR NEGATIVE NEGATIVE Final   Influenza B by PCR NEGATIVE NEGATIVE Final    Comment: (NOTE) The Xpert Xpress SARS-CoV-2/FLU/RSV plus assay is intended as an aid in the diagnosis of influenza from Nasopharyngeal swab specimens and should not be used as a sole basis for treatment. Nasal washings and aspirates are unacceptable for Xpert Xpress SARS-CoV-2/FLU/RSV testing.  Fact Sheet for Patients: BloggerCourse.comhttps://www.fda.gov/media/152166/download  Fact Sheet for Healthcare Providers: SeriousBroker.ithttps://www.fda.gov/media/152162/download  This test is not yet approved or cleared by the [place] FDA and has been authorized for detection and/or diagnosis of SARS-CoV-2 by FDA under an Emergency Use Authorization (EUA). This EUA will remain in effect (meaning this test can be used) for the duration of the COVID-19 declaration under Section 564(b)(1) of the Act, 21 U.S.C. section 360bbb-3(b)(1), unless the authorization is terminated or revoked.  Performed at [facility], [address] [place], [place] [address]       Radiology Studies: DG Chest Port 1 View  Result Date: 11/10/2020 CLINICAL DATA:  Hypoxemia.  COVID 19 infection. EXAM: PORTABLE CHEST 1 VIEW COMPARISON:  November 06, 2020, November 04, 2020 FINDINGS: The cardiomediastinal silhouette  is unchanged in contour. No pleural effusion. No pneumothorax. LEFT greater than RIGHT multifocal bilateral peripheral predominant heterogeneous opacities, consistent with the sequela of COVID-19 infection. These are mildly increased in comparison to most recent prior. Visualized abdomen is unremarkable. Levocurvature of the superior thoracic spine, unchanged. IMPRESSION: Mild worsening of bilateral multifocal peripheral predominant heterogeneous opacities consistent with COVID-19 infection. Electronically Signed   By: Meda KlinefelterStephanie  Peacock MD   On: 11/10/2020 10:59       LOS: 8 days   Osvaldo ShipperGokul Rosamond Andress  [facility] Pager on [email or web address]  11/11/2020, 12:59 PM

## 2020-11-12 ENCOUNTER — Inpatient Hospital Stay (HOSPITAL_COMMUNITY): Payer: BC Managed Care – PPO

## 2020-11-12 LAB — D-DIMER, QUANTITATIVE: D-Dimer, Quant: 0.97 ug/mL-FEU — ABNORMAL HIGH (ref 0.00–0.50)

## 2020-11-12 LAB — BASIC METABOLIC PANEL
Anion gap: 12 (ref 5–15)
BUN: 23 mg/dL — ABNORMAL HIGH (ref 6–20)
CO2: 24 mmol/L (ref 22–32)
Calcium: 8.5 mg/dL — ABNORMAL LOW (ref 8.9–10.3)
Chloride: 99 mmol/L (ref 98–111)
Creatinine, Ser: 1.04 mg/dL (ref 0.61–1.24)
GFR, Estimated: >60 mL/min (ref >60)
Glucose, Bld: 133 mg/dL — ABNORMAL HIGH (ref 70–99)
Potassium: 5.1 mmol/L (ref 3.5–5.1)
Sodium: 135 mmol/L (ref 135–145)

## 2020-11-12 MED ORDER — METHYLPREDNISOLONE SODIUM SUCC 40 MG IJ SOLR
40.0000 mg | Freq: Two times a day (BID) | INTRAMUSCULAR | Status: DC
  Administered 2020-11-12: 40 mg via INTRAVENOUS
  Filled 2020-11-12: qty 1

## 2020-11-12 NOTE — Progress Notes (Addendum)
PROGRESS NOTE  Justin Archer [id number]:[id number] DOB: September 29, 1981 DOA: 11/03/2020  PCP: [facility]  Brief History/Interval Summary: 40 year old with a history of HTN, hypogonadism, and generalized anxiety disorder who was sent to [facility] ED from a local urgent care due to hypoxia.  He reports developing generalized weakness muscle aches and intermittent fevers 3 days before Christmas, leading him to present to an urgent care on 12/24 where he was diagnosed with Covid.  Since that time the patient has developed a severe cough and progressive shortness of breath.  Due to progression of his symptoms he return to the urgent care 1/1 for reevaluation when he was found to be hypoxic in the 80s and sent to the ED.  Reason for Visit: Pneumonia due to COVID-19.  Acute respiratory failure with hypoxia  Consultants: None  Procedures: None  Antibiotics: Anti-infectives (From admission, onward)   Start     Dose/Rate Route Frequency Ordered Stop   11/04/20 1000  remdesivir 100 mg in sodium chloride 0.9 % 100 mL IVPB       "Followed by" Linked Group Details   100 mg 200 mL/hr over 30 Minutes Intravenous Daily 11/03/20 1829 11/07/20 0935   11/03/20 1830  remdesivir 200 mg in sodium chloride 0.9% 250 mL IVPB       "Followed by" Linked Group Details   200 mg 580 mL/hr over 30 Minutes Intravenous Once 11/03/20 1829 11/03/20 2210      Subjective/Interval History: Patient mentions that he continues to feel better.  Less anxious.  Less short of breath.  Denies any chest pain.  Cough is improving .   Assessment/Plan:  Acute Hypoxic Resp. Failure/Pneumonia due to COVID-19   Recent Labs  Lab 11/06/20 0436 11/07/20 0421 11/08/20 0436 11/09/20 0500 11/10/20 0316 11/11/20 0623 11/12/20 0730  DDIMER 3.45* 5.05* 6.45* 5.84* 3.05* 1.56* 0.97*  CRP 1.9* 0.9 0.6 1.1*  --   --   --   ALT 71* 65* 61* 54*  --   --   --      Objective findings: Oxygen requirements: Over the last 48 hours  he appears to have improved.  He is now down to 3 L of oxygen by nasal cannula.  Saturating in the early 90s.    COVID 19 Therapeutics: Antibacterials: None Remdesivir: Completed course of Remdesivir Steroids: Remains on Solu-Medrol Diuretics: No further doses of Lasix for today Inhaled Steroids: Dulera Actemra: Given Actemra PUD Prophylaxis: Pepcid DVT Prophylaxis:  Lovenox  From a respiratory standpoint patient appears to be improving.  He is now down to 3 L of oxygen by nasal cannula.  Patient has completed course of Remdesivir.  Remains on steroids.  He was also given Actemra.  He is on inhaled steroids.  Patient was also given furosemide.  We will hold today.  Tachycardia mainly with exertion.  Also due to anxiety.  D-dimer peaked at 6.45. CT angiogram done on 1/2 did not show any PE.  Lower extremity Doppler study did not show any DVT.  Elevated D-dimer most likely secondary to COVID-19.  Continue prophylactic doses of Lovenox.  Incentive spirometry and mobilization.  Prone positioning as much as tolerated.  The treatment plan and use of medications and known side effects were discussed with patient/family. Some of the medications used are based on case reports/anecdotal data.  All other medications being used in the management of COVID-19 based on limited study data.  Complete risks and long-term side effects are unknown, however in the best clinical  judgment they seem to be of some benefit.  Patient/family wanted to proceed with treatment options provided.  Hyponatremia Stable  Mildly elevated transaminases Secondary to COVID-19.  Improved.  Essential hypertension/sinus tachycardia Blood pressure reasonably well controlled.  Currently not on any antihypertensives.  Generalized anxiety disorder Stable.  Continue to monitor.  Xanax as needed.  Dose of paroxetine was increased.   Right thyroid nodule Incidentally noted on CT chest.  Will need outpatient work-up for same.  TSH  and free T4 were normal.  Obesity Estimated body mass index is 42.79 kg/m as calculated from the following:   Height as of this encounter: 6' 0.52" (1.842 m).   Weight as of this encounter: 145.2 kg.   DVT Prophylaxis: Lovenox Code Status: Full code Family Communication: Wife being updated daily. Disposition Plan: Hopefully return home when improved.  Anticipate discharge tomorrow.  Status is: Inpatient  Remains inpatient appropriate because:IV treatments appropriate due to intensity of illness or inability to take PO and Inpatient level of care appropriate due to severity of illness   Dispo:  Patient From: Home  Planned Disposition: Home  Expected discharge date: 11/13/2020  Medically stable for discharge: No       Medications:  Scheduled: . vitamin C  500 mg Oral Daily  . enoxaparin (LOVENOX) injection  40 mg Subcutaneous Q24H  . famotidine  20 mg Oral Daily  . lisinopril  10 mg Oral Daily   And  . hydrochlorothiazide  12.5 mg Oral Daily  . methylPREDNISolone (SOLU-MEDROL) injection  80 mg Intravenous Q12H  . mometasone-formoterol  2 puff Inhalation BID  . PARoxetine  20 mg Oral Daily  . zinc sulfate  220 mg Oral Daily   Continuous:  LFY:BOFBPZWCHENID, albuterol, ALPRAZolam, guaiFENesin-codeine, hydrALAZINE, ondansetron **OR** ondansetron (ZOFRAN) IV, polyethylene glycol, sodium chloride   Objective:  Vital Signs  Vitals:   11/11/20 0715 11/11/20 1204 11/11/20 2025 11/12/20 0522  BP:  125/78 133/85 128/79  Pulse:  (!) 109 100 96  Resp: 20 18 20 20   Temp:  98.1 F (36.7 C) 98.4 F (36.9 C) 98.6 F (37 C)  TempSrc:   Oral Oral  SpO2: 99% 95% 96% 94%  Weight:      Height:       No intake or output data in the 24 hours ending 11/12/20 1228 Filed Weights   11/09/20 1500  Weight: (!) 145.2 kg    General appearance: Awake alert.  In no distress.  Less anxious Resp: Improved effort.  Diminished air entry at the bases without any definite crackles.  No  wheezing or rhonchi. Cardio: S1-S2 is tachycardic regular.  No S3-S4.  No rubs murmurs or bruit GI: Abdomen is soft.  Nontender nondistended.  Bowel sounds are present normal.  No masses organomegaly Extremities: No edema.  Full range of motion of lower extremities. Neurologic: Alert and oriented x3.  No focal neurological deficits.      Lab Results:  Data Reviewed: I have personally reviewed following labs and imaging studies  CBC: Recent Labs  Lab 11/06/20 0436 11/07/20 0421 11/08/20 0436 11/09/20 0500  WBC 12.3* 11.6* 11.7* 12.4*  NEUTROABS 10.5* 10.2* 10.3* 10.7*  HGB 16.1 15.3 15.8 16.4  HCT 49.0 45.6 47.1 48.9  MCV 85.8 85.9 85.8 85.5  PLT 264 235 236 240    Basic Metabolic Panel: Recent Labs  Lab 11/06/20 0436 11/07/20 0421 11/08/20 0436 11/09/20 0500 11/10/20 0316 11/11/20 0623 11/12/20 0730  NA 138 137 136 135 137 134* 135  K  4.0 4.1 4.0 4.0 4.5 4.7 5.1  CL 102 102 102 103 101 98 99  CO2 23 25 22  21* 24 22 24   GLUCOSE 146* 179* 167* 243* 220* 166* 133*  BUN 12 15 15 13 14 20  23*  CREATININE 0.82 0.89 0.86 0.97 0.90 0.99 1.04  CALCIUM 8.4* 8.2* 8.0* 8.1* 8.3* 8.6* 8.5*  MG 2.0 2.0 2.1 2.1  --   --   --     GFR: Estimated Creatinine Clearance: 145 mL/min (by C-G formula based on SCr of 1.04 mg/dL).  Liver Function Tests: Recent Labs  Lab 11/06/20 0436 11/07/20 0421 11/08/20 0436 11/09/20 0500  AST 77* 49* 31 28  ALT 71* 65* 61* 54*  ALKPHOS 49 48 50 48  BILITOT 1.1 1.0 0.8 0.8  PROT 5.6* 5.3* 5.1* 5.0*  ALBUMIN 2.9* 2.7* 2.7* 2.7*     Recent Results (from the past 240 hour(s))  Blood Culture (routine x 2)     Status: None   Collection Time: 11/03/20  8:22 PM   Specimen: BLOOD  Result Value Ref Range Status   Specimen Description BLOOD RIGHT ANTECUBITAL  Final   Special Requests   Final    BOTTLES DRAWN AEROBIC AND ANAEROBIC Blood Culture adequate volume   Culture   Final    NO GROWTH 5 DAYS Performed at [facility], [address] [place], [facility] [address]    Report Status 11/08/2020 FINAL  Final  Blood Culture (routine x 2)     Status: None   Collection Time: 11/03/20  8:23 PM   Specimen: BLOOD  Result Value Ref Range Status   Specimen Description BLOOD LEFT ANTECUBITAL  Final   Special Requests   Final    BOTTLES DRAWN AEROBIC AND ANAEROBIC Blood Culture adequate volume   Culture   Final    NO GROWTH 5 DAYS Performed at [facility], [address] [place], [facility] [address]    Report Status 11/08/2020 FINAL  Final  Resp Panel by RT-PCR (Flu A&B, Covid) Nasopharyngeal Swab     Status: Abnormal   Collection Time: 11/03/20  9:45 PM   Specimen: Nasopharyngeal Swab; Nasopharyngeal(NP) swabs in vial transport medium  Result Value Ref Range Status   SARS Coronavirus 2 by RT PCR POSITIVE (A) NEGATIVE Final    Comment: RESULT CALLED TO, READ BACK BY AND VERIFIED WITH: [id number] RN 11/03/20 0037 JDW (NOTE) SARS-CoV-2 target nucleic acids are DETECTED.  The SARS-CoV-2 RNA is generally detectable in upper respiratory specimens during the acute phase of infection. Positive results are indicative of the presence of the identified virus, but do not rule out bacterial infection or co-infection with other pathogens not detected by the test. Clinical correlation with patient history and other diagnostic information is necessary to determine patient infection status. The expected result is Negative.  Fact Sheet for Patients: 01/01/21  Fact Sheet for Healthcare Providers: Heloise Purpura  This test is not yet approved or cleared by the 01/01/21 FDA and  has been authorized for detection and/or diagnosis of SARS-CoV-2 by FDA under an Emergency Use Authorization (EUA).  This EUA will remain in effect (meaning this test can be u sed) for the duration of  the COVID-19 declaration under Section 564(b)(1) of the Act, 21 U.S.C. section  360bbb-3(b)(1), unless the authorization is terminated or revoked sooner.     Influenza A by PCR NEGATIVE NEGATIVE Final   Influenza B by PCR NEGATIVE NEGATIVE Final    Comment: (  NOTE) The Xpert Xpress SARS-CoV-2/FLU/RSV plus assay is intended as an aid in the diagnosis of influenza from Nasopharyngeal swab specimens and should not be used as a sole basis for treatment. Nasal washings and aspirates are unacceptable for Xpert Xpress SARS-CoV-2/FLU/RSV testing.  Fact Sheet for Patients: BloggerCourse.comhttps://www.fda.gov/media/152166/download  Fact Sheet for Healthcare Providers: SeriousBroker.ithttps://www.fda.gov/media/152162/download  This test is not yet approved or cleared by the [place] FDA and has been authorized for detection and/or diagnosis of SARS-CoV-2 by FDA under an Emergency Use Authorization (EUA). This EUA will remain in effect (meaning this test can be used) for the duration of the COVID-19 declaration under Section 564(b)(1) of the Act, 21 U.S.C. section 360bbb-3(b)(1), unless the authorization is terminated or revoked.  Performed at [facility], [address] [place], [place] [address]       Radiology Studies: DG Chest Port 1 View  Result Date: 11/12/2020 CLINICAL DATA:  Pneumonia due to COVID-19 EXAM: PORTABLE CHEST 1 VIEW COMPARISON:  Radiograph 11/10/2020, CT 11/04/2020 FINDINGS: Persistent heterogeneous opacities are present throughout both lungs most coalescent in the left mid to upper lung periphery and right lung base though overall slightly improved from comparison radiography. No pneumothorax. No effusion. Stable cardiomediastinal contours. No acute osseous or soft tissue abnormality. IMPRESSION: Persistent though slightly diminished heterogeneous opacities throughout both lungs compatible infection in the setting of COVID-19. Electronically Signed   By: Kreg ShropshirePrice  DeHay M.D.   On: 11/12/2020 06:19       LOS: 9 days   Augustine Leverette Foot LockerKrishnan  [facility] Pager on  [email or web address]  11/12/2020, 12:28 PM

## 2020-11-12 NOTE — Progress Notes (Signed)
SATURATION QUALIFICATIONS: (This note is used to comply with regulatory documentation for home oxygen)  Patient Saturations on Room Air at Rest = 93%  Patient Saturations on Room Air while Ambulating = 87%  Patient Saturations on 1 Liters of oxygen while Ambulating = 91%  Please briefly explain why patient needs home oxygen: Patient oxygen saturations dropped to 87% while ambulating on RA. Patient was able to recover to 90% or greater with 1L .

## 2020-11-13 LAB — BASIC METABOLIC PANEL
Anion gap: 9 (ref 5–15)
BUN: 25 mg/dL — ABNORMAL HIGH (ref 6–20)
CO2: 25 mmol/L (ref 22–32)
Calcium: 8.4 mg/dL — ABNORMAL LOW (ref 8.9–10.3)
Chloride: 98 mmol/L (ref 98–111)
Creatinine, Ser: 0.99 mg/dL (ref 0.61–1.24)
GFR, Estimated: >60 mL/min (ref >60)
Glucose, Bld: 146 mg/dL — ABNORMAL HIGH (ref 70–99)
Potassium: 5 mmol/L (ref 3.5–5.1)
Sodium: 132 mmol/L — ABNORMAL LOW (ref 135–145)

## 2020-11-13 LAB — D-DIMER, QUANTITATIVE: D-Dimer, Quant: 0.79 ug/mL-FEU — ABNORMAL HIGH (ref 0.00–0.50)

## 2020-11-13 MED ORDER — MOMETASONE FURO-FORMOTEROL FUM 100-5 MCG/ACT IN AERO: 2.0000 | INHALATION_SPRAY | Freq: Two times a day (BID) | RESPIRATORY_TRACT | Status: AC

## 2020-11-13 MED ORDER — GUAIFENESIN-CODEINE 100-10 MG/5ML PO SOLN: 10.0000 mL | Freq: Four times a day (QID) | ORAL | 0 refills | Status: AC | PRN

## 2020-11-13 MED ORDER — PREDNISONE 20 MG PO TABS: ORAL_TABLET | ORAL | 0 refills | Status: AC

## 2020-11-13 MED ORDER — ALBUTEROL SULFATE HFA 108 (90 BASE) MCG/ACT IN AERS: 2.0000 | INHALATION_SPRAY | RESPIRATORY_TRACT | 0 refills | Status: AC | PRN

## 2020-11-13 MED ORDER — ALPRAZOLAM 0.25 MG PO TABS: 0.2500 mg | ORAL_TABLET | Freq: Two times a day (BID) | ORAL | 0 refills | Status: AC | PRN

## 2020-11-13 MED ORDER — BENZONATATE 100 MG PO CAPS: 100.0000 mg | ORAL_CAPSULE | Freq: Three times a day (TID) | ORAL | 0 refills | Status: AC | PRN

## 2020-11-13 MED ORDER — FAMOTIDINE 20 MG PO TABS: 20.0000 mg | ORAL_TABLET | Freq: Every day | ORAL | 0 refills | Status: AC

## 2020-11-13 MED ORDER — PAROXETINE HCL 20 MG PO TABS: 20.0000 mg | ORAL_TABLET | Freq: Every day | ORAL | 1 refills | Status: AC

## 2020-11-13 NOTE — Discharge Summary (Signed)
[facility]  Physician Discharge Summary   Patient ID: Justin Archer MRN: [id number] DOB/AGE: 03/06/1981 39 y.o.  Admit date: 11/03/2020 Discharge date: 11/13/2020  PCP: [facility]  DISCHARGE DIAGNOSES:  Pneumonia due to COVID-19 Acute respiratory failure with hypoxia, improved Generalized anxiety disorder Essential hypertension  RECOMMENDATIONS FOR OUTPATIENT FOLLOW UP: 1. Outpatient work-up for right thyroid nodule. 2. Home oxygen has been ordered    Home Health: None Equipment/Devices: Home oxygen  CODE STATUS: Full code  DISCHARGE CONDITION: fair  Diet recommendation: Low-sodium  INITIAL HISTORY: 40 year old with a history of HTN, hypogonadism, and generalized anxiety disorder who was sent to [facility] ED from a local urgent care due to hypoxia. He reports developing generalized weakness muscle aches and intermittent fevers 3 days before Christmas, leading him to present to an urgent care on 12/24 where he was diagnosed with Covid. Since that time the patient has developed a severe cough and progressive shortness of breath. Due to progression of his symptoms he return to the urgent care 1/1 for reevaluation when he was found to be hypoxic in the 80s and sent to the ED.   HOSPITAL COURSE:   Acute Hypoxic Resp. Failure/Pneumonia due to COVID-19 Patient was hospitalized and started on remdesivir and steroids.  He was also given Actemra.  Patient was requiring high amounts of oxygen initially.  Slowly he was able to wean down.  He was also given furosemide.  His inflammatory markers were elevated.  D-dimer was also elevated.  CT angiogram did not show any PE.  Lower extremity Doppler studies were negative for DVT.  Elevated D-dimer most likely secondary to COVID-19.  Respiratory status has improved.  He is now down to 1 to 2 L of oxygen.  Home oxygen has been ordered.  Hyponatremia Stable  Mildly elevated transaminases Secondary to COVID-19.   Improved.  Essential hypertension/sinus tachycardia Blood pressure reasonably well controlled.  Currently not on any antihypertensives.  Generalized anxiety disorder Stable.  Continue to monitor.  Xanax as needed.  Dose of paroxetine was increased.   Right thyroid nodule Incidentally noted on CT chest.  Will need outpatient work-up for same.  TSH and free T4 were normal.  These findings were communicated to patient's wife.  Obesity Estimated body mass index is 42.79 kg/m as calculated from the following:   Height as of this encounter: 6' 0.52" (1.842 m).   Weight as of this encounter: 145.2 kg.   Overall stable.  Okay for discharge home today.  PERTINENT LABS:  The results of significant diagnostics from this hospitalization (including imaging, microbiology, ancillary and laboratory) are listed below for reference.    Microbiology: Recent Results (from the past 240 hour(s))  Blood Culture (routine x 2)     Status: None   Collection Time: 11/03/20  8:22 PM   Specimen: BLOOD  Result Value Ref Range Status   Specimen Description BLOOD RIGHT ANTECUBITAL  Final   Special Requests   Final    BOTTLES DRAWN AEROBIC AND ANAEROBIC Blood Culture adequate volume   Culture   Final    NO GROWTH 5 DAYS Performed at [facility], [address], [place], [place] [address]    Report Status 11/08/2020 FINAL  Final  Blood Culture (routine x 2)     Status: None   Collection Time: 11/03/20  8:23 PM   Specimen: BLOOD  Result Value Ref Range Status   Specimen Description BLOOD LEFT ANTECUBITAL  Final   Special Requests  Final    BOTTLES DRAWN AEROBIC AND ANAEROBIC Blood Culture adequate volume   Culture   Final    NO GROWTH 5 DAYS Performed at [facility], [address], [place], [place] [address]    Report Status 11/08/2020 FINAL  Final  Resp Panel by RT-PCR (Flu A&B, Covid) Nasopharyngeal Swab     Status: Abnormal   Collection Time: 11/03/20  9:45 PM   Specimen:  Nasopharyngeal Swab; Nasopharyngeal(NP) swabs in vial transport medium  Result Value Ref Range Status   SARS Coronavirus 2 by RT PCR POSITIVE (A) NEGATIVE Final    Comment: RESULT CALLED TO, READ BACK BY AND VERIFIED WITH: Heloise Purpura RN 11/03/20 0037 JDW (NOTE) SARS-CoV-2 target nucleic acids are DETECTED.  The SARS-CoV-2 RNA is generally detectable in upper respiratory specimens during the acute phase of infection. Positive results are indicative of the presence of the identified virus, but do not rule out bacterial infection or co-infection with other pathogens not detected by the test. Clinical correlation with patient history and other diagnostic information is necessary to determine patient infection status. The expected result is Negative.  Fact Sheet for Patients: BloggerCourse.com  Fact Sheet for Healthcare Providers: SeriousBroker.it  This test is not yet approved or cleared by the [place] FDA and  has been authorized for detection and/or diagnosis of SARS-CoV-2 by FDA under an Emergency Use Authorization (EUA).  This EUA will remain in effect (meaning this test can be u sed) for the duration of  the COVID-19 declaration under Section 564(b)(1) of the Act, 21 U.S.C. section 360bbb-3(b)(1), unless the authorization is terminated or revoked sooner.     Influenza A by PCR NEGATIVE NEGATIVE Final   Influenza B by PCR NEGATIVE NEGATIVE Final    Comment: (NOTE) The Xpert Xpress SARS-CoV-2/FLU/RSV plus assay is intended as an aid in the diagnosis of influenza from Nasopharyngeal swab specimens and should not be used as a sole basis for treatment. Nasal washings and aspirates are unacceptable for Xpert Xpress SARS-CoV-2/FLU/RSV testing.  Fact Sheet for Patients: BloggerCourse.com  Fact Sheet for Healthcare Providers: SeriousBroker.it  This test is not yet approved or  cleared by the [place] FDA and has been authorized for detection and/or diagnosis of SARS-CoV-2 by FDA under an Emergency Use Authorization (EUA). This EUA will remain in effect (meaning this test can be used) for the duration of the COVID-19 declaration under Section 564(b)(1) of the Act, 21 U.S.C. section 360bbb-3(b)(1), unless the authorization is terminated or revoked.  Performed at [facility], [address], [place], [place] [address]      Labs:  COVID-19 Labs  Recent Labs    11/11/20 9326 11/12/20 0730 11/13/20 0421  DDIMER 1.56* 0.97* 0.79*    Lab Results  Component Value Date   SARSCOV2NAA POSITIVE (A) 11/03/2020      Basic Metabolic Panel: Recent Labs  Lab 11/07/20 0421 11/08/20 0436 11/09/20 0500 11/10/20 0316 11/11/20 0623 11/12/20 0730 11/13/20 0421  NA 137 136 135 137 134* 135 132*  K 4.1 4.0 4.0 4.5 4.7 5.1 5.0  CL 102 102 103 101 98 99 98  CO2 25 22 21* 24 22 24 25   GLUCOSE 179* 167* 243* 220* 166* 133* 146*  BUN 15 15 13 14 20  23* 25*  CREATININE 0.89 0.86 0.97 0.90 0.99 1.04 0.99  CALCIUM 8.2* 8.0* 8.1* 8.3* 8.6* 8.5* 8.4*  MG 2.0 2.1 2.1  --   --   --   --  Liver Function Tests: Recent Labs  Lab 11/07/20 0421 11/08/20 0436 11/09/20 0500  AST 49* 31 28  ALT 65* 61* 54*  ALKPHOS 48 50 48  BILITOT 1.0 0.8 0.8  PROT 5.3* 5.1* 5.0*  ALBUMIN 2.7* 2.7* 2.7*   CBC: Recent Labs  Lab 11/07/20 0421 11/08/20 0436 11/09/20 0500  WBC 11.6* 11.7* 12.4*  NEUTROABS 10.2* 10.3* 10.7*  HGB 15.3 15.8 16.4  HCT 45.6 47.1 48.9  MCV 85.9 85.8 85.5  PLT 235 236 240    IMAGING STUDIES CT ANGIO CHEST PE W OR WO CONTRAST  Result Date: 11/04/2020 CLINICAL DATA:  COVID positive.  Hypoxia. EXAM: CT ANGIOGRAPHY CHEST WITH CONTRAST TECHNIQUE: Multidetector CT imaging of the chest was performed using the standard protocol during bolus administration of intravenous contrast. Multiplanar CT image reconstructions and MIPs were obtained  to evaluate the vascular anatomy. CONTRAST:  75mL OMNIPAQUE IOHEXOL 350 MG/ML SOLN COMPARISON:  Chest radiograph from one day prior. FINDINGS: Cardiovascular: The study is moderate quality for the evaluation of pulmonary embolism, degraded by motion. There are no convincing filling defects in the central, lobar, segmental or subsegmental pulmonary artery branches to suggest acute pulmonary embolism. Normal course and caliber of the thoracic aorta. Dilated main pulmonary artery (3.6 cm diameter). Normal heart size. No significant pericardial fluid/thickening. Mediastinum/Nodes: Suggestion of a hypodense 2.5 cm right thyroid nodule. Unremarkable esophagus. No pathologically enlarged axillary, mediastinal or hilar lymph nodes. Lungs/Pleura: No pneumothorax. No pleural effusion. Extensive patchy ground-glass opacity throughout both lungs involving all lung lobes. No lung masses or significant pulmonary nodules. Upper abdomen: Small hiatal hernia. Musculoskeletal:  No aggressive appearing focal osseous lesions. Review of the MIP images confirms the above findings. IMPRESSION: 1. No evidence of pulmonary embolism. 2. Extensive patchy ground-glass opacity throughout both lungs, compatible with COVID-19 pneumonia. 3. Dilated main pulmonary artery, suggesting pulmonary arterial hypertension. 4. Small hiatal hernia. 5. Suggestion of a 2.5 cm right thyroid nodule. Recommend thyroid US (ref: J Am Coll Radiol. 2015 Feb;12(2): 143-50). Electronically Signed   By: Delbert PhenixJason A Poff M.D.   On: 11/04/2020 08:02   DG Chest Port 1 View  Result Date: 11/12/2020 CLINICAL DATA:  Pneumonia due to COVID-19 EXAM: PORTABLE CHEST 1 VIEW COMPARISON:  Radiograph 11/10/2020, CT 11/04/2020 FINDINGS: Persistent heterogeneous opacities are present throughout both lungs most coalescent in the left mid to upper lung periphery and right lung base though overall slightly improved from comparison radiography. No pneumothorax. No effusion. Stable  cardiomediastinal contours. No acute osseous or soft tissue abnormality. IMPRESSION: Persistent though slightly diminished heterogeneous opacities throughout both lungs compatible infection in the setting of COVID-19. Electronically Signed   By: Kreg ShropshirePrice  DeHay M.D.   On: 11/12/2020 06:19   DG Chest Port 1 View  Result Date: 11/10/2020 CLINICAL DATA:  Hypoxemia.  COVID 19 infection. EXAM: PORTABLE CHEST 1 VIEW COMPARISON:  November 06, 2020, November 04, 2020 FINDINGS: The cardiomediastinal silhouette is unchanged in contour. No pleural effusion. No pneumothorax. LEFT greater than RIGHT multifocal bilateral peripheral predominant heterogeneous opacities, consistent with the sequela of COVID-19 infection. These are mildly increased in comparison to most recent prior. Visualized abdomen is unremarkable. Levocurvature of the superior thoracic spine, unchanged. IMPRESSION: Mild worsening of bilateral multifocal peripheral predominant heterogeneous opacities consistent with COVID-19 infection. Electronically Signed   By: Meda KlinefelterStephanie  Peacock MD   On: 11/10/2020 10:59   DG Chest Port 1 View  Result Date: 11/06/2020 CLINICAL DATA:  Pneumonia.  COVID-19. EXAM: PORTABLE CHEST 1 VIEW COMPARISON:  CT 11/04/2020.  Chest x-ray 11/03/2020. FINDINGS: Stable cardiomegaly. Diffuse bilateral pulmonary infiltrates/edema again noted. Similar findings noted on prior exams. No pleural effusion or pneumothorax. No acute bony abnormality. IMPRESSION: 1. Stable cardiomegaly. 2. Diffuse bilateral pulmonary infiltrates/edema again noted. Similar findings noted on prior exams. Electronically Signed   By: Maisie Fus  Register   On: 11/06/2020 06:51   DG Chest Port 1 View  Result Date: 11/03/2020 CLINICAL DATA:  Focal positive EXAM: PORTABLE CHEST 1 VIEW COMPARISON:  None. FINDINGS: The heart size and mediastinal contours are mildly enlarged. Extensive multifocal patchy airspace opacity seen throughout both lungs, left greater than right. No pleural  effusion. No acute osseous abnormality. IMPRESSION: Extensive airspace opacities, consistent with multifocal pneumonia Electronically Signed   By: Jonna Clark M.D.   On: 11/03/2020 19:20   VAS [place] LOWER EXTREMITY VENOUS (DVT)  Result Date: 11/08/2020  Lower Venous DVT Study Indications: Covid-19, elevated D-Dimer.  Comparison Study: No prior study Performing Technologist: Sherren Kerns RVS  Examination Guidelines: A complete evaluation includes B-mode imaging, spectral Doppler, color Doppler, and power Doppler as needed of all accessible portions of each vessel. Bilateral testing is considered an integral part of a complete examination. Limited examinations for reoccurring indications may be performed as noted. The reflux portion of the exam is performed with the patient in reverse Trendelenburg.  +---------+---------------+---------+-----------+----------+--------------+ RIGHT    CompressibilityPhasicitySpontaneityPropertiesThrombus Aging +---------+---------------+---------+-----------+----------+--------------+ CFV      Full           Yes      Yes                                 +---------+---------------+---------+-----------+----------+--------------+ SFJ      Full                                                        +---------+---------------+---------+-----------+----------+--------------+ FV Prox  Full                                                        +---------+---------------+---------+-----------+----------+--------------+ FV Mid   Full                                                        +---------+---------------+---------+-----------+----------+--------------+ FV DistalFull                                                        +---------+---------------+---------+-----------+----------+--------------+ PFV      Full                                                         +---------+---------------+---------+-----------+----------+--------------+ POP  Full           Yes      Yes                                 +---------+---------------+---------+-----------+----------+--------------+ PTV      Full                                                        +---------+---------------+---------+-----------+----------+--------------+ PERO     Full                                                        +---------+---------------+---------+-----------+----------+--------------+   +---------+---------------+---------+-----------+----------+--------------+ LEFT     CompressibilityPhasicitySpontaneityPropertiesThrombus Aging +---------+---------------+---------+-----------+----------+--------------+ CFV      Full           Yes      Yes                                 +---------+---------------+---------+-----------+----------+--------------+ SFJ      Full                                                        +---------+---------------+---------+-----------+----------+--------------+ FV Prox  Full                                                        +---------+---------------+---------+-----------+----------+--------------+ FV Mid   Full                                                        +---------+---------------+---------+-----------+----------+--------------+ FV DistalFull                                                        +---------+---------------+---------+-----------+----------+--------------+ PFV      Full                                                        +---------+---------------+---------+-----------+----------+--------------+ POP      Full           Yes      Yes                                 +---------+---------------+---------+-----------+----------+--------------+  PTV      Full                                                         +---------+---------------+---------+-----------+----------+--------------+ PERO     Full                                                        +---------+---------------+---------+-----------+----------+--------------+     Summary: BILATERAL: - No evidence of deep vein thrombosis seen in the lower extremities, bilaterally. -   *See table(s) above for measurements and observations. Electronically signed by Coral Else MD on 11/08/2020 at 3:34:42 PM.    Final     DISCHARGE EXAMINATION: Vitals:   11/12/20 0522 11/12/20 1806 11/12/20 2244 11/13/20 0632  BP: 128/79 (!) 105/51 (!) 142/68 (!) 143/65  Pulse: 96 100 (!) 105   Resp: 20 20 20    Temp: 98.6 F (37 C) 98 F (36.7 C) 97.9 F (36.6 C) 97.8 F (36.6 C)  TempSrc: Oral Oral  Oral  SpO2: 94% 97% 94% 95%  Weight:      Height:       General appearance: Awake alert.  In no distress Resp: Improved effort.  Few crackles bilateral bases.  No wheezing or rhonchi Cardio: S1-S2 is normal regular.  No S3-S4.  No rubs murmurs or bruit GI: Abdomen is soft.  Nontender nondistended.  Bowel sounds are present normal.  No masses organomegaly Extremities: No edema.  Full range of motion of lower extremities. Neurologic: Alert and oriented x3.  No focal neurological deficits.    DISPOSITION: Home  Discharge Instructions    Call MD for:  difficulty breathing, headache or visual disturbances   Complete by: As directed    Call MD for:  extreme fatigue   Complete by: As directed    Call MD for:  persistant dizziness or light-headedness   Complete by: As directed    Call MD for:  persistant nausea and vomiting   Complete by: As directed    Call MD for:  severe uncontrolled pain   Complete by: As directed    Call MD for:  temperature >100.4   Complete by: As directed    Diet - low sodium heart healthy   Complete by: As directed    Discharge instructions   Complete by: As directed    You can come off of isolation on January 15.  Please  take your medications as prescribed.  Follow-up with your primary care provider next week.  Continue to use oxygen at home.  Discussed with your PCP regarding getting a sleep study done in the outpatient setting to evaluate for sleep apnea.   COVID 19 INSTRUCTIONS  - You are felt to be stable enough to no longer require inpatient monitoring, testing, and treatment, though you will need to follow the recommendations below: - Based on the CDC's non-test criteria for ending self-isolation: You may not return to work/leave the home until at least 20 days since symptom onset AND 24 hours without a fever (without taking tylenol, ibuprofen, etc.) AND have improvement in respiratory symptoms. - Do not take NSAID medications (including, but not limited  to, ibuprofen, advil, motrin, naproxen, aleve, goody's powder, etc.) - Follow up with your doctor in the next week via telehealth or seek medical attention right away if your symptoms get WORSE.    Directions for you at home:  Wear a facemask You should wear a facemask that covers your nose and mouth when you are in the same room with other people and when you visit a healthcare provider. People who live with or visit you should also wear a facemask while they are in the same room with you.  Separate yourself from other people in your home As much as possible, you should stay in a different room from other people in your home. Also, you should use a separate bathroom, if available.  Avoid sharing household items You should not share dishes, drinking glasses, cups, eating utensils, towels, bedding, or other items with other people in your home. After using these items, you should wash them thoroughly with soap and water.  Cover your coughs and sneezes Cover your mouth and nose with a tissue when you cough or sneeze, or you can cough or sneeze into your sleeve. Throw used tissues in a lined trash can, and immediately wash your hands with soap and  water for at least 20 seconds or use an alcohol-based hand rub.  Wash your [employer] your hands often and thoroughly with soap and water for at least 20 seconds. You can use an alcohol-based hand sanitizer if soap and water are not available and if your hands are not visibly dirty. Avoid touching your eyes, nose, and mouth with unwashed hands.  Directions for those who live with, or provide care at home for you:  Limit the number of people who have contact with the patient If possible, have only one caregiver for the patient. Other household members should stay in another home or place of residence. If this is not possible, they should stay in another room, or be separated from the patient as much as possible. Use a separate bathroom, if available. Restrict visitors who do not have an essential need to be in the home.  Ensure good ventilation Make sure that shared spaces in the home have good air flow, such as from an air conditioner or an opened window, weather permitting.  Wash your hands often Wash your hands often and thoroughly with soap and water for at least 20 seconds. You can use an alcohol based hand sanitizer if soap and water are not available and if your hands are not visibly dirty. Avoid touching your eyes, nose, and mouth with unwashed hands. Use disposable paper towels to dry your hands. If not available, use dedicated cloth towels and replace them when they become wet.  Wear a facemask and gloves Wear a disposable facemask at all times in the room and gloves when you touch or have contact with the patient's blood, body fluids, and/or secretions or excretions, such as sweat, saliva, sputum, nasal mucus, vomit, urine, or feces.  Ensure the mask fits over your nose and mouth tightly, and do not touch it during use. Throw out disposable facemasks and gloves after using them. Do not reuse. Wash your hands immediately after removing your facemask and gloves. If your personal  clothing becomes contaminated, carefully remove clothing and launder. Wash your hands after handling contaminated clothing. Place all used disposable facemasks, gloves, and other waste in a lined container before disposing them with other household waste. Remove gloves and wash your hands immediately after handling these  items.  Do not share dishes, glasses, or other household items with the patient Avoid sharing household items. You should not share dishes, drinking glasses, cups, eating utensils, towels, bedding, or other items with a patient who is confirmed to have, or being evaluated for, COVID-19 infection. After the person uses these items, you should wash them thoroughly with soap and water.  Wash laundry thoroughly Immediately remove and wash clothes or bedding that have blood, body fluids, and/or secretions or excretions, such as sweat, saliva, sputum, nasal mucus, vomit, urine, or feces, on them. Wear gloves when handling laundry from the patient. Read and follow directions on labels of laundry or clothing items and detergent. In general, wash and dry with the warmest temperatures recommended on the label.  Clean all areas the individual has used often Clean all touchable surfaces, such as counters, tabletops, doorknobs, bathroom fixtures, toilets, phones, keyboards, tablets, and bedside tables, every day. Also, clean any surfaces that may have blood, body fluids, and/or secretions or excretions on them. Wear gloves when cleaning surfaces the patient has come in contact with. Use a diluted bleach solution (e.g., dilute bleach with 1 part bleach and 10 parts water) or a household disinfectant with a label that says EPA-registered for coronaviruses. To make a bleach solution at home, add 1 tablespoon of bleach to 1 quart (4 cups) of water. For a larger supply, add  cup of bleach to 1 gallon (16 cups) of water. Read labels of cleaning products and follow recommendations provided on product  labels. Labels contain instructions for safe and effective use of the cleaning product including precautions you should take when applying the product, such as wearing gloves or eye protection and making sure you have good ventilation during use of the product. Remove gloves and wash hands immediately after cleaning.  Monitor yourself for signs and symptoms of illness Caregivers and household members are considered close contacts, should monitor their health, and will be asked to limit movement outside of the home to the extent possible. Follow the monitoring steps for close contacts listed on the symptom monitoring form.   If you have additional questions, contact your local health department or call the epidemiologist on call at [phone] (available 24/7). This guidance is subject to change. For the most up-to-date guidance from [facility], please refer to their website: TripMetro.huhttps://www.cdc.gov/coronavirus/2019-ncov/hcp/guidance-prevent-spread.html  You were cared for by a hospitalist during your hospital stay. If you have any questions about your discharge medications or the care you received while you were in the hospital after you are discharged, you can call the unit and asked to speak with the hospitalist on call if the hospitalist that took care of you is not available. Once you are discharged, your primary care physician will handle any further medical issues. Please note that NO REFILLS for any discharge medications will be authorized once you are discharged, as it is imperative that you return to your primary care physician (or establish a relationship with a primary care physician if you do not have one) for your aftercare needs so that they can reassess your need for medications and monitor your lab values. If you do not have a primary care physician, you can call [phone] for a physician referral.   Increase activity slowly   Complete by: As directed         Allergies as of 11/13/2020       Reactions   Iodine Swelling, Other (See Comments)   Swelling (foot became swollen after being placed in  a "bath" that contained iodine)      Medication List    STOP taking these medications   cephALEXin 500 MG capsule Commonly known as: Keflex   oxyCODONE 5 MG immediate release tablet Commonly known as: Oxy IR/ROXICODONE   sildenafil 100 MG tablet Commonly known as: Viagra     TAKE these medications   albuterol 108 (90 Base) MCG/ACT inhaler Commonly known as: VENTOLIN HFA Inhale 2 puffs into the lungs every 4 (four) hours as needed for wheezing or shortness of breath.   ALPRAZolam 0.25 MG tablet Commonly known as: XANAX Take 1 tablet (0.25 mg total) by mouth 2 (two) times daily as needed for anxiety.   benzonatate 100 MG capsule Commonly known as: Tessalon Perles Take 1 capsule (100 mg total) by mouth 3 (three) times daily as needed for cough.   famotidine 20 MG tablet Commonly known as: PEPCID Take 1 tablet (20 mg total) by mouth daily for 10 days. Start taking on: November 14, 2020   guaiFENesin-codeine 100-10 MG/5ML syrup Take 10 mLs by mouth every 6 (six) hours as needed for cough.   ibuprofen 200 MG tablet Commonly known as: ADVIL Take 400 mg by mouth every 6 (six) hours as needed for mild pain or headache.   lisinopril-hydrochlorothiazide 10-12.5 MG tablet Commonly known as: ZESTORETIC Take 1 tablet by mouth in the morning.   mometasone-formoterol 100-5 MCG/ACT Aero Commonly known as: DULERA Inhale 2 puffs into the lungs 2 (two) times daily.   PARoxetine 20 MG tablet Commonly known as: PAXIL Take 1 tablet (20 mg total) by mouth daily. What changed:   medication strength  how much to take   predniSONE 20 MG tablet Commonly known as: DELTASONE Take 3 tablets once daily for 3 days followed by 2 tablets once daily for 3 days followed by 1 tablet once daily for 3 days and then stop   testosterone cypionate 200 MG/ML injection Commonly known as:  DEPOTESTOSTERONE CYPIONATE Inject 200 mg into the muscle See admin instructions. Inject 200 mg (1 ml) into the muscle every 10 days            Durable Medical Equipment  (From admission, onward)         Start     Ordered   11/12/20 1229  For home use only DME oxygen  Once       Question Answer Comment  Length of Need 6 Months   Mode or (Route) Nasal cannula   Liters per Minute 2   Frequency Continuous (stationary and portable oxygen unit needed)   Oxygen conserving device Yes   Oxygen delivery system Gas      11/12/20 1228            Follow-up Information    Physicians, [facility]. Schedule an appointment as soon as possible for a visit in 1 week(s).   Specialty: Family Medicine Contact information: [address] [place] [address] [phone]               TOTAL DISCHARGE TIME: 35 minutes  Amaura Authier Rito Ehrlich  [facility] Pager on [email or web address]  11/13/2020, 12:38 PM

## 2020-11-13 NOTE — Discharge Instructions (Signed)

## 2020-11-13 NOTE — TOC Progression Note (Signed)
Transition of Care [facility]) - Progression Note    Patient Details  Name: Justin Archer MRN: [id number] Date of Birth: [birth year]  [facility]) CM/SW Contact  Beckie Busing, RN Phone Number: [phone]  11/13/2020, 1:42 PM  Clinical Narrative:    Home O2 has been set up through [employer].  O2 has been delivered to the patient. No further needs noted TOC will sign off.         Expected Discharge Plan and Services           Expected Discharge Date: 11/13/20               DME Arranged: Oxygen DME Agency: Other - Comment [facility]) Date DME Agency Contacted: 11/13/20 Time DME Agency Contacted: [phone] Representative spoke with at DME Agency: Vaughan Basta             Social Determinants of Health (SDOH) Interventions    Readmission Risk Interventions No flowsheet data found.

## 2020-11-13 NOTE — Progress Notes (Signed)
Patient verbalized understanding of discharge instructions. D/c home with wife,

## 2020-11-22 DIAGNOSIS — E041 Nontoxic single thyroid nodule: Secondary | ICD-10-CM | POA: Diagnosis not present

## 2020-11-22 DIAGNOSIS — J9601 Acute respiratory failure with hypoxia: Secondary | ICD-10-CM | POA: Diagnosis not present

## 2020-11-22 DIAGNOSIS — U071 COVID-19: Secondary | ICD-10-CM | POA: Diagnosis not present

## 2020-12-03 DIAGNOSIS — E041 Nontoxic single thyroid nodule: Secondary | ICD-10-CM | POA: Diagnosis not present

## 2020-12-05 DIAGNOSIS — R5383 Other fatigue: Secondary | ICD-10-CM | POA: Diagnosis not present

## 2020-12-05 DIAGNOSIS — Z7189 Other specified counseling: Secondary | ICD-10-CM | POA: Diagnosis not present

## 2020-12-05 DIAGNOSIS — G4733 Obstructive sleep apnea (adult) (pediatric): Secondary | ICD-10-CM | POA: Diagnosis not present

## 2020-12-05 DIAGNOSIS — E662 Morbid (severe) obesity with alveolar hypoventilation: Secondary | ICD-10-CM | POA: Diagnosis not present

## 2020-12-10 DIAGNOSIS — R896 Abnormal cytological findings in specimens from other organs, systems and tissues: Secondary | ICD-10-CM | POA: Diagnosis not present

## 2020-12-10 DIAGNOSIS — E041 Nontoxic single thyroid nodule: Secondary | ICD-10-CM | POA: Diagnosis not present

## 2020-12-13 DIAGNOSIS — G4733 Obstructive sleep apnea (adult) (pediatric): Secondary | ICD-10-CM | POA: Diagnosis not present

## 2020-12-17 DIAGNOSIS — E662 Morbid (severe) obesity with alveolar hypoventilation: Secondary | ICD-10-CM | POA: Diagnosis not present

## 2020-12-17 DIAGNOSIS — R5383 Other fatigue: Secondary | ICD-10-CM | POA: Diagnosis not present

## 2020-12-17 DIAGNOSIS — G4733 Obstructive sleep apnea (adult) (pediatric): Secondary | ICD-10-CM | POA: Diagnosis not present

## 2020-12-19 DIAGNOSIS — G4733 Obstructive sleep apnea (adult) (pediatric): Secondary | ICD-10-CM | POA: Diagnosis not present

## 2021-08-18 IMAGING — DX DG CHEST 1V PORT
1 series · 2 of 2 positions shown · non-contrast
Comparison: CT 11/04/2020.  Chest x-ray 11/03/2020.

CLINICAL DATA: Pneumonia.  [id number].

EXAM:
PORTABLE CHEST 1 VIEW

[Series 1: chest · 0.14mm/px · 2 of 2 slices shown]
[im 1/2]
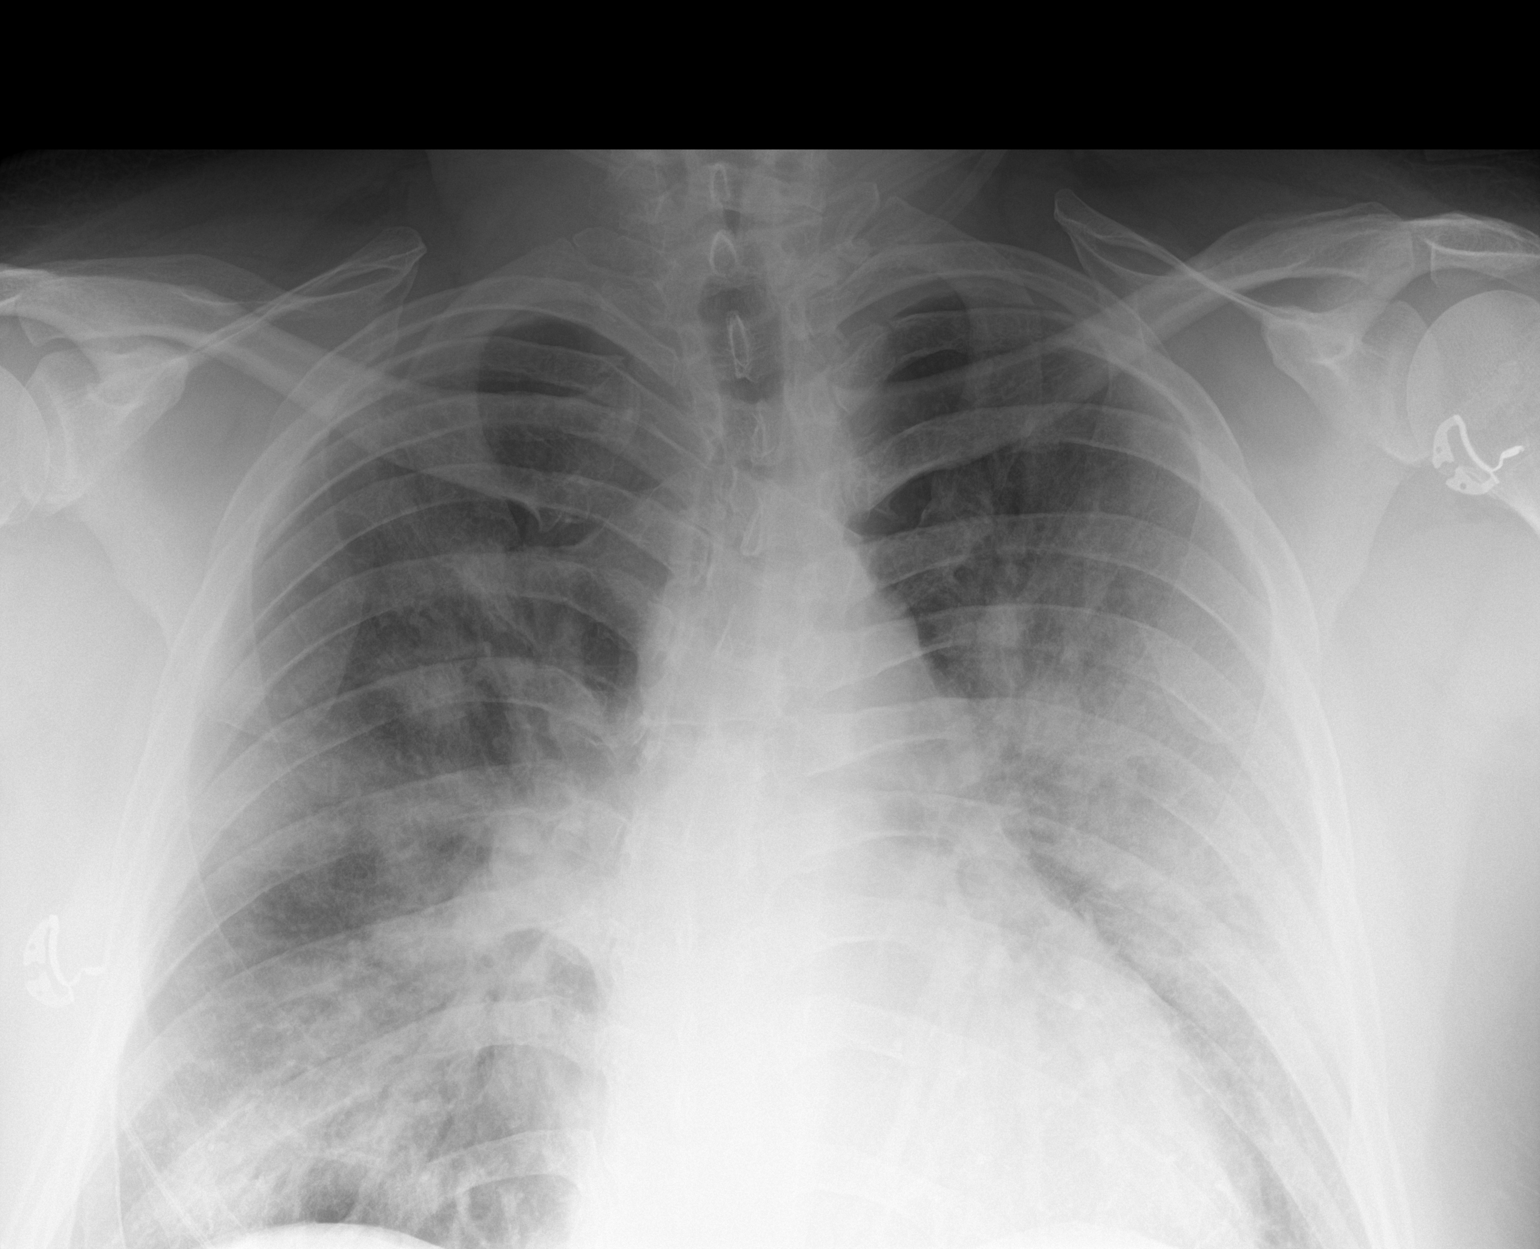
[im 2/2]
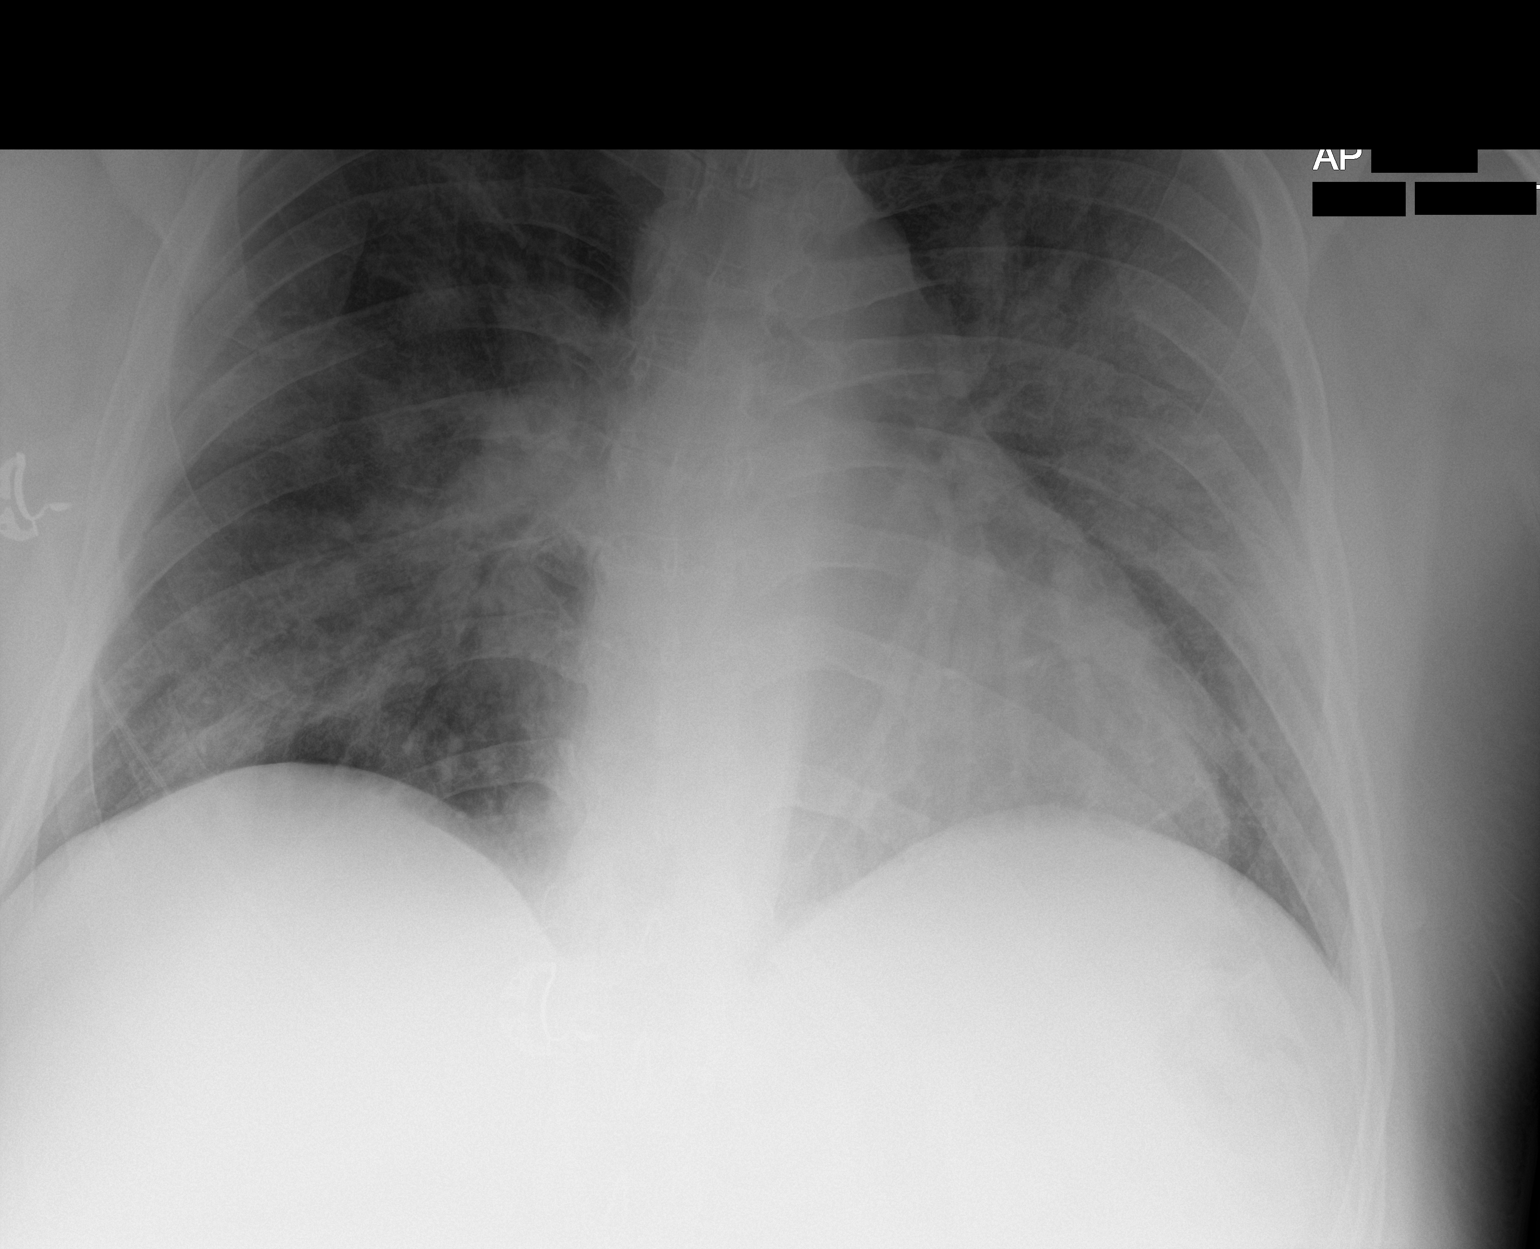

[2 of 2 positions shown; findings below may reference images not displayed]

FINDINGS: Stable cardiomegaly. Diffuse bilateral pulmonary infiltrates/edema
again noted. Similar findings noted on prior exams. No pleural
effusion or pneumothorax. No acute bony abnormality.
IMPRESSION: 1. Stable cardiomegaly.
2. Diffuse bilateral pulmonary infiltrates/edema again noted.
Similar findings noted on prior exams.

## 2021-08-22 IMAGING — DX DG CHEST 1V PORT
1 series · 2 of 2 positions shown · non-contrast
Comparison: [DATE] [DATE], [DATE], [DATE] [DATE], [DATE]

CLINICAL DATA: Hypoxemia.  COVID 19 infection.

EXAM:
PORTABLE CHEST 1 VIEW

[Series 1: chest · 0.14mm/px · 2 of 2 slices shown]
[im 1/2]
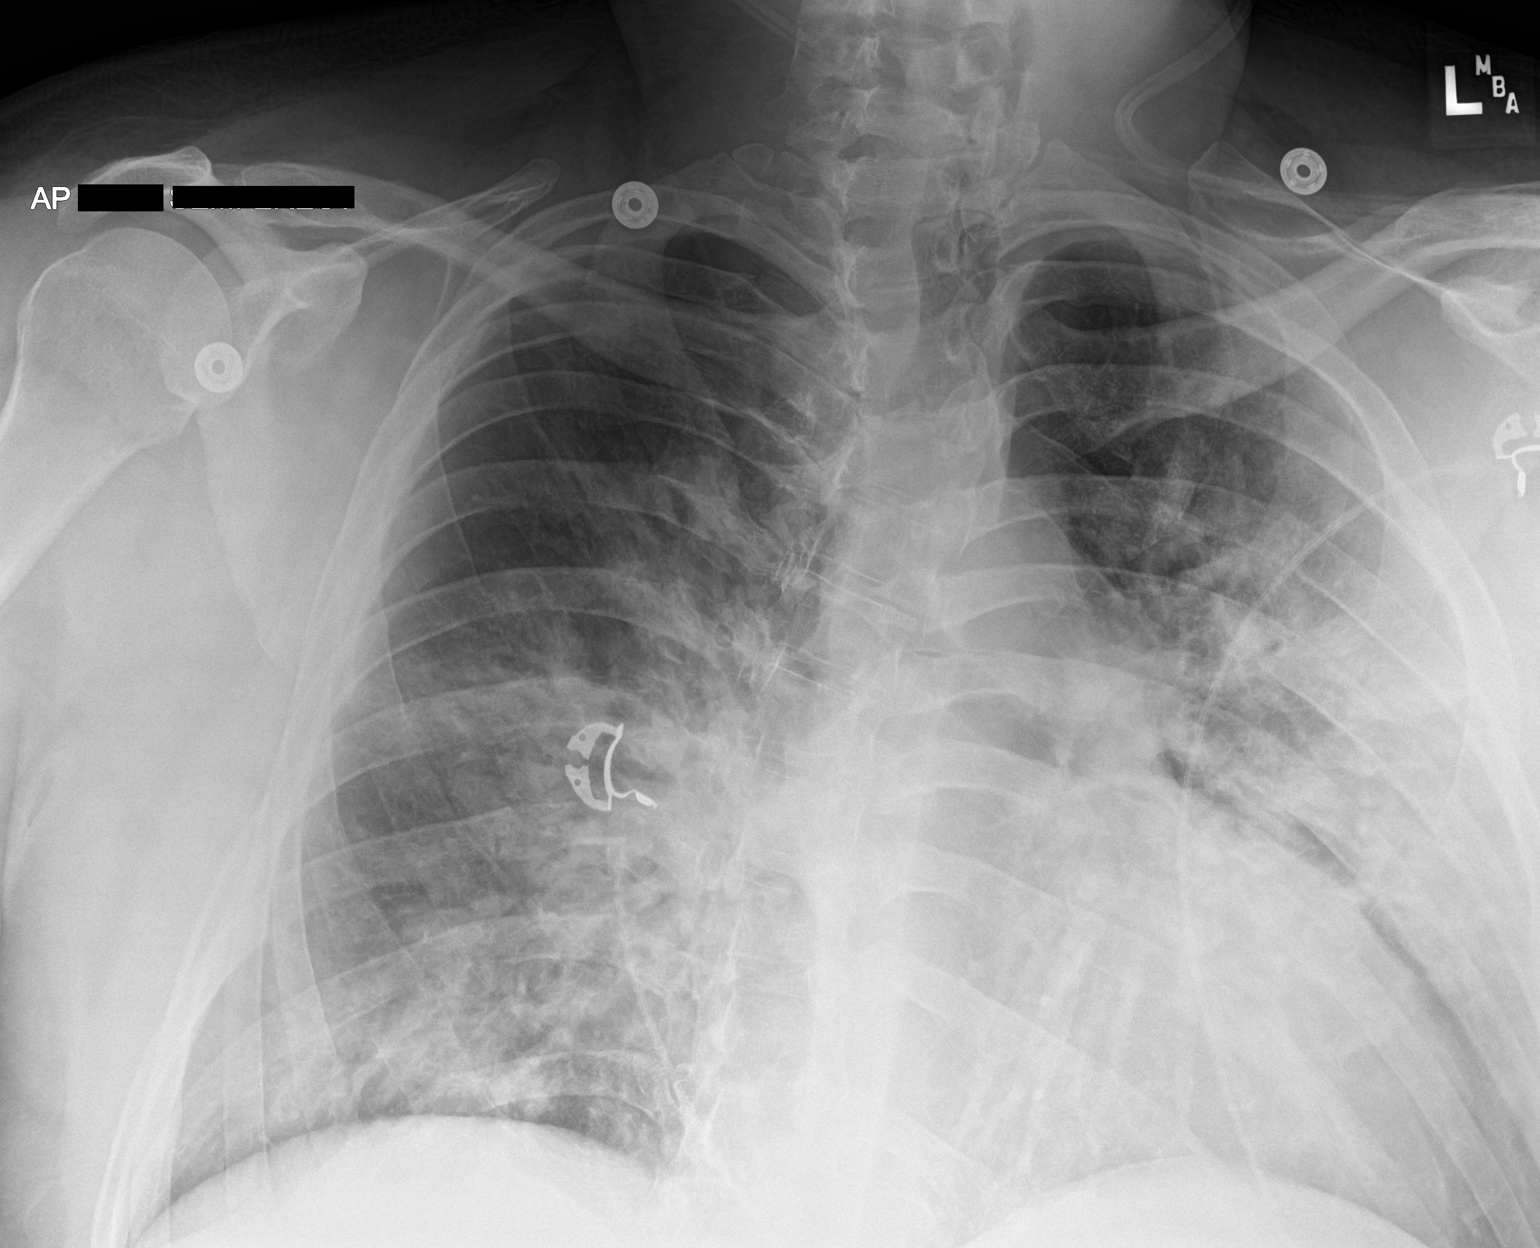
[im 2/2]
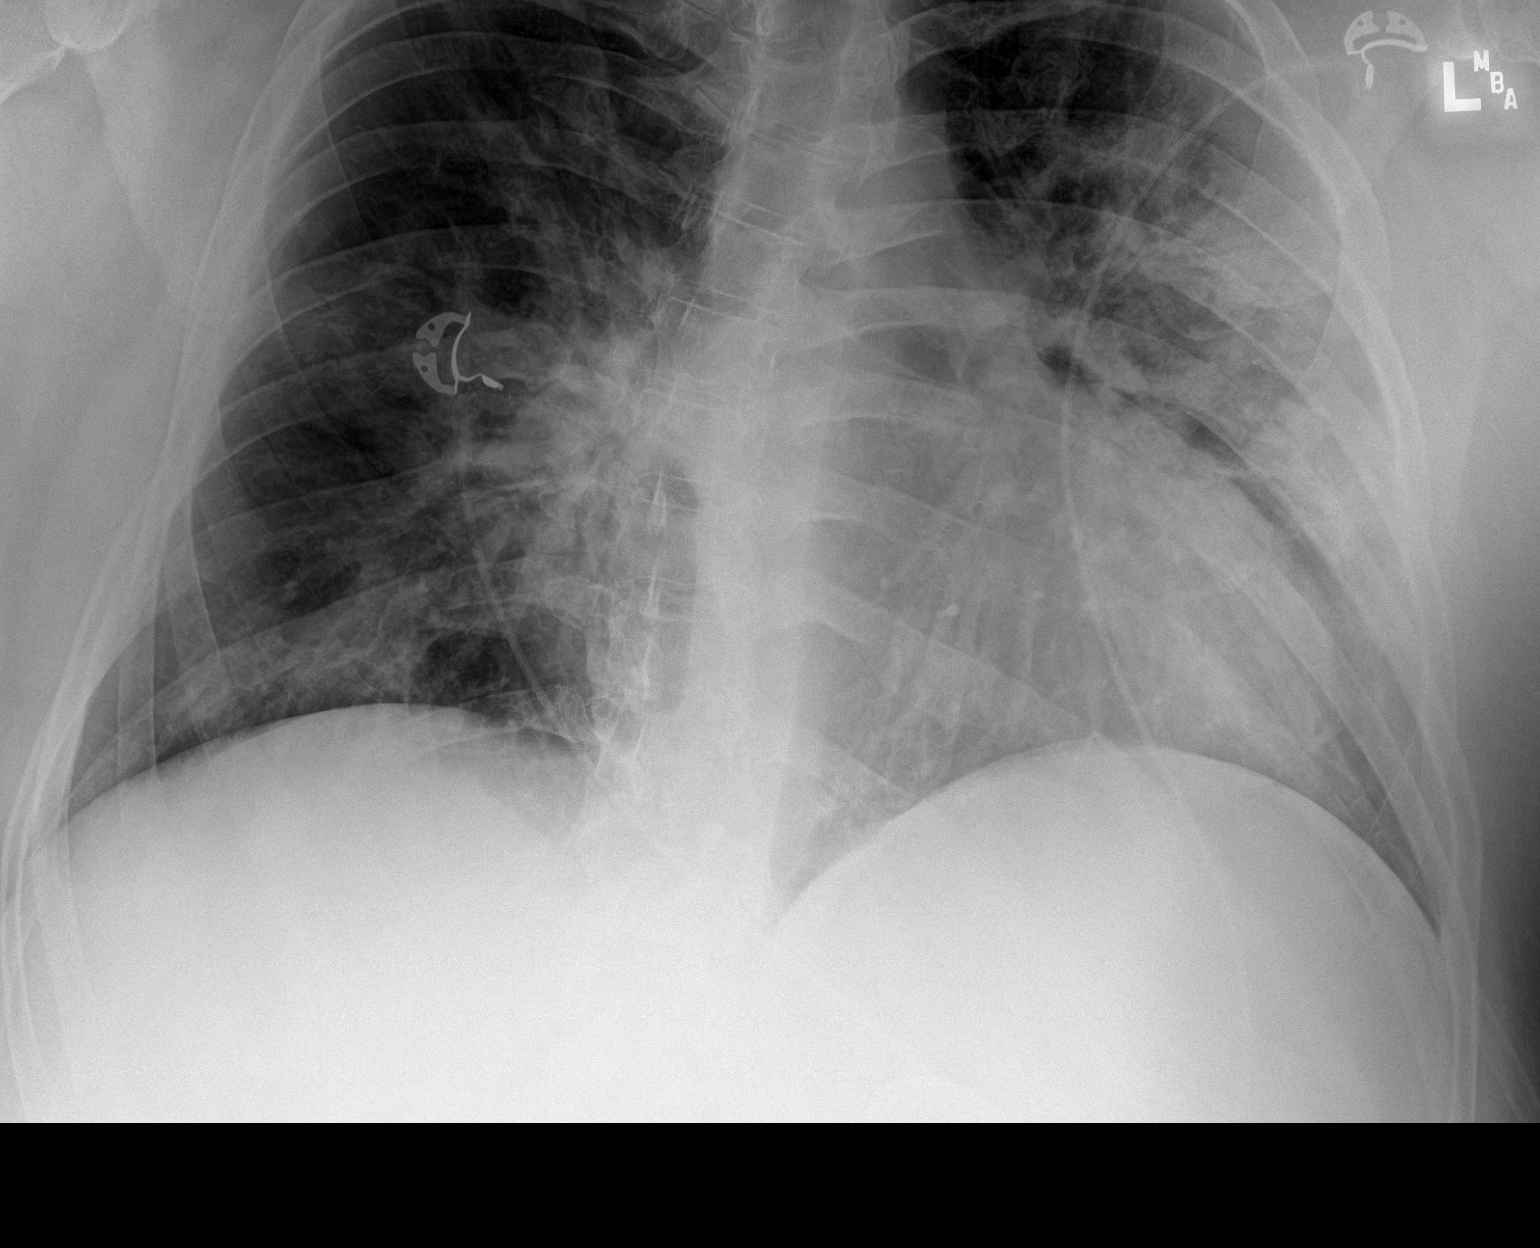

[2 of 2 positions shown; findings below may reference images not displayed]

FINDINGS: The cardiomediastinal silhouette is unchanged in contour. No pleural
effusion. No pneumothorax. LEFT greater than RIGHT multifocal
bilateral peripheral predominant heterogeneous opacities, consistent
with the sequela of LQJMH-II infection. These are mildly increased
in comparison to most recent prior. Visualized abdomen is
unremarkable. Levocurvature of the superior thoracic spine,
unchanged.
IMPRESSION: Mild worsening of bilateral multifocal peripheral predominant
heterogeneous opacities consistent with LQJMH-II infection.
